# Patient Record
Sex: Female | Born: 1965 | Race: White | Hispanic: No | Marital: Married | Smoking: Former smoker
Health system: Southern US, Community
[De-identification: ages and names within clinical notes are randomized; demographics above are authoritative.]

## PROBLEM LIST (undated history)

## (undated) DIAGNOSIS — M1990 Unspecified osteoarthritis, unspecified site: Secondary | ICD-10-CM

## (undated) DIAGNOSIS — I10 Essential (primary) hypertension: Secondary | ICD-10-CM

## (undated) DIAGNOSIS — K219 Gastro-esophageal reflux disease without esophagitis: Secondary | ICD-10-CM

## (undated) DIAGNOSIS — G4733 Obstructive sleep apnea (adult) (pediatric): Secondary | ICD-10-CM

## (undated) DIAGNOSIS — F419 Anxiety disorder, unspecified: Secondary | ICD-10-CM

## (undated) DIAGNOSIS — E785 Hyperlipidemia, unspecified: Secondary | ICD-10-CM

## (undated) DIAGNOSIS — R011 Cardiac murmur, unspecified: Secondary | ICD-10-CM

## (undated) DIAGNOSIS — E119 Type 2 diabetes mellitus without complications: Secondary | ICD-10-CM

## (undated) DIAGNOSIS — G4730 Sleep apnea, unspecified: Secondary | ICD-10-CM

## (undated) HISTORY — PX: CHOLECYSTECTOMY: SHX55

## (undated) HISTORY — DX: Cardiac murmur, unspecified: R01.1

## (undated) HISTORY — DX: Type 2 diabetes mellitus without complications: E11.9

## (undated) HISTORY — DX: Gastro-esophageal reflux disease without esophagitis: K21.9

## (undated) HISTORY — DX: Hyperlipidemia, unspecified: E78.5

## (undated) HISTORY — DX: Anxiety disorder, unspecified: F41.9

## (undated) HISTORY — DX: Essential (primary) hypertension: I10

## (undated) HISTORY — DX: Sleep apnea, unspecified: G47.30

## (undated) HISTORY — DX: Unspecified osteoarthritis, unspecified site: M19.90

## (undated) HISTORY — DX: Obstructive sleep apnea (adult) (pediatric): G47.33

---

## 1998-02-11 ENCOUNTER — Other Ambulatory Visit: Admission: RE | Admit: 1998-02-11 | Discharge: 1998-02-11 | Payer: Self-pay | Admitting: Obstetrics and Gynecology

## 2000-01-27 ENCOUNTER — Encounter: Payer: Self-pay | Admitting: Family Medicine

## 2000-01-27 ENCOUNTER — Ambulatory Visit (HOSPITAL_COMMUNITY): Admission: RE | Admit: 2000-01-27 | Discharge: 2000-01-27 | Payer: Self-pay | Admitting: Family Medicine

## 2000-10-31 ENCOUNTER — Ambulatory Visit (HOSPITAL_COMMUNITY): Admission: RE | Admit: 2000-10-31 | Discharge: 2000-10-31 | Payer: Self-pay | Admitting: Family Medicine

## 2000-10-31 ENCOUNTER — Encounter: Payer: Self-pay | Admitting: Family Medicine

## 2000-11-20 ENCOUNTER — Other Ambulatory Visit: Admission: RE | Admit: 2000-11-20 | Discharge: 2000-11-20 | Payer: Self-pay | Admitting: Obstetrics and Gynecology

## 2001-06-06 ENCOUNTER — Ambulatory Visit (HOSPITAL_COMMUNITY): Admission: RE | Admit: 2001-06-06 | Discharge: 2001-06-06 | Payer: Self-pay | Admitting: Family Medicine

## 2001-06-06 ENCOUNTER — Encounter: Payer: Self-pay | Admitting: Family Medicine

## 2003-10-30 ENCOUNTER — Other Ambulatory Visit: Admission: RE | Admit: 2003-10-30 | Discharge: 2003-10-30 | Payer: Self-pay | Admitting: Obstetrics and Gynecology

## 2005-11-08 ENCOUNTER — Other Ambulatory Visit: Admission: RE | Admit: 2005-11-08 | Discharge: 2005-11-08 | Payer: Self-pay | Admitting: Obstetrics and Gynecology

## 2009-03-03 ENCOUNTER — Encounter: Admission: RE | Admit: 2009-03-03 | Discharge: 2009-03-03 | Payer: Self-pay | Admitting: Obstetrics and Gynecology

## 2009-05-29 ENCOUNTER — Encounter: Payer: Self-pay | Admitting: Family Medicine

## 2009-05-29 LAB — CONVERTED CEMR LAB: Pap Smear: NORMAL

## 2010-02-28 ENCOUNTER — Ambulatory Visit: Payer: Self-pay | Admitting: Family Medicine

## 2010-02-28 DIAGNOSIS — F411 Generalized anxiety disorder: Secondary | ICD-10-CM | POA: Insufficient documentation

## 2010-08-24 ENCOUNTER — Encounter
Admission: RE | Admit: 2010-08-24 | Discharge: 2010-08-24 | Payer: Self-pay | Source: Home / Self Care | Attending: Obstetrics and Gynecology | Admitting: Obstetrics and Gynecology

## 2010-08-29 LAB — HM MAMMOGRAPHY: HM Mammogram: NORMAL

## 2010-09-18 ENCOUNTER — Encounter: Payer: Self-pay | Admitting: Family Medicine

## 2010-09-30 NOTE — Assessment & Plan Note (Signed)
Summary: L arm bug bite x 1 dy rm 3   Vital Signs:  Patient Profile:   45 Years Old Female CC:      Bug bite - L arm x 1 dy Height:     60 inches Weight:      185 pounds O2 Sat:      100 % O2 treatment:    Room Air Temp:     99.2 degrees F oral Pulse rate:   86 / minute Pulse rhythm:   regular Resp:     16 per minute BP sitting:   132 / 88  (right arm) Cuff size:   regular  Vitals Entered By: Areta Haber CMA (February 28, 2010 4:36 PM)                  Current Allergies: No known allergies History of Present Illness Chief Complaint: Bug bite - L arm x 1 dy History of Present Illness:  Subjective:  Patient was outside 2 days ago and awoke yesterday with some type of insect bite on left forearm.  The area of redness has increased over the past 24 hours, and has been slightly warm.  She feels well.  No fevers, chills, and sweats   Current Problems: INSECT BITE, LOWER ARM (ICD-913.4) FAMILY HISTORY DIABETES 1ST DEGREE RELATIVE (ICD-V18.0) ANXIETY (ICD-300.00)   Current Meds ZOLOFT 50 MG TABS (SERTRALINE HCL) 1 tab by mouth once daily CEPHALEXIN 500 MG CAPS (CEPHALEXIN) One by mouth two times a day  REVIEW OF SYSTEMS Constitutional Symptoms      Denies fever, chills, night sweats, weight loss, weight gain, and fatigue.  Eyes       Denies change in vision, eye pain, eye discharge, glasses, contact lenses, and eye surgery. Ear/Nose/Throat/Mouth       Denies hearing loss/aids, change in hearing, ear pain, ear discharge, dizziness, frequent runny nose, frequent nose bleeds, sinus problems, sore throat, hoarseness, and tooth pain or bleeding.  Respiratory       Denies dry cough, productive cough, wheezing, shortness of breath, asthma, bronchitis, and emphysema/COPD.  Cardiovascular       Denies murmurs, chest pain, and tires easily with exhertion.    Gastrointestinal       Denies stomach pain, nausea/vomiting, diarrhea, constipation, blood in bowel movements, and  indigestion. Genitourniary       Denies painful urination, kidney stones, and loss of urinary control. Neurological       Denies paralysis, seizures, and fainting/blackouts. Musculoskeletal       Complains of redness and swelling.      Denies muscle pain, joint pain, joint stiffness, decreased range of motion, muscle weakness, and gout.  Skin       Denies bruising, unusual mles/lumps or sores, and hair/skin or nail changes.      Comments: L arm - bug bite x 1 dy Psych       Denies mood changes, temper/anger issues, anxiety/stress, speech problems, depression, and sleep problems.  Past History:  Past Medical History: Anxiety  Past Surgical History: Denies surgical history  Family History: Family History Diabetes 1st degree relative Family History High cholesterol Family History Hypertension Family History of Cardiovascular disorder  Social History: Married Never Smoked Alcohol use-no Drug use-yes Regular exercise-yes Smoking Status:  never Drug Use:  yes Does Patient Exercise:  yes   Objective:  No acute distress  Left forearm:  3cm dia erythematous macule, non tender. Assessment New Problems: INSECT BITE, LOWER ARM (ICD-913.4) FAMILY HISTORY DIABETES  1ST DEGREE RELATIVE (ICD-V18.0) ANXIETY (ICD-300.00)  INSECT BITE; ? EARLY CELLULITIS  Plan New Medications/Changes: CEPHALEXIN 500 MG CAPS (CEPHALEXIN) One by mouth two times a day  #14 x 0, 02/28/2010, Donna Christen MD  New Orders: New Patient Level III [id number] Planning Comments:   Begin Keflex.  Recommend Benadryl 25mg , 2 caps every 6 to 8 hours. Follow-up with PCP if not improving.   The patient and/or caregiver has been counseled thoroughly with regard to medications prescribed including dosage, schedule, interactions, rationale for use, and possible side effects and they verbalize understanding.  Diagnoses and expected course of recovery discussed and will return if not improved as expected or if the  condition worsens. Patient and/or caregiver verbalized understanding.  Prescriptions: CEPHALEXIN 500 MG CAPS (CEPHALEXIN) One by mouth two times a day  #14 x 0   Entered and Authorized by:   Donna Christen MD   Signed by:   Donna Christen MD on 02/28/2010   Method used:   Print then Give to Patient   RxID:   [id number]   Patient Instructions: 1)  Take Benadryl 25mg , 2 caps every 6 to 8 hours until skin reaction improved.  Orders Added: 1)  New Patient Level III [95621]

## 2010-10-19 ENCOUNTER — Ambulatory Visit (INDEPENDENT_AMBULATORY_CARE_PROVIDER_SITE_OTHER): Payer: Commercial Managed Care - PPO | Admitting: Emergency Medicine

## 2010-10-19 ENCOUNTER — Encounter: Payer: Self-pay | Admitting: Emergency Medicine

## 2010-10-19 DIAGNOSIS — J020 Streptococcal pharyngitis: Secondary | ICD-10-CM

## 2010-10-19 LAB — CONVERTED CEMR LAB: Rapid Strep: NEGATIVE

## 2010-10-20 ENCOUNTER — Encounter: Payer: Self-pay | Admitting: Emergency Medicine

## 2010-10-22 ENCOUNTER — Telehealth (INDEPENDENT_AMBULATORY_CARE_PROVIDER_SITE_OTHER): Payer: Self-pay | Admitting: *Deleted

## 2010-10-26 NOTE — Progress Notes (Signed)
  Phone Note Outgoing Call Call back at Home Phone [phone] P PH     Call placed by: Lajean Saver RN,  October 22, 2010 2:57 PM Call placed to: Patient Action Taken: Phone Call Completed Summary of Call: Callback: Patient given negative throat culture result.

## 2010-10-26 NOTE — Assessment & Plan Note (Signed)
Summary: SORE THROAT/TJ rm 3   Vital Signs:  Patient Profile:   45 Years Old Female CC:      sore throat Height:     60 inches O2 Sat:      97 % O2 treatment:    Room Air Temp:     98.5 degrees F oral Pulse rate:   81 / minute Resp:     16 per minute BP sitting:   156 / 100  (left arm) Cuff size:   regular  Vitals Entered By: Clemens Catholic LPN (October 19, 2010 7:24 PM)                  Updated Prior Medication List: No Medications Current Allergies (reviewed today): No known allergies History of Present Illness History from: patient Chief Complaint: sore throat History of Present Illness: 45 Years Old Female complains of onset of cold symptoms for 1 day.  Charna has been using no OTC meds.  He daughter was just dx with strep via culture yesterday at our clinic. + sore raw throat No cough No pleuritic pain No wheezing No nasal congestion No post-nasal drainage No sinus pain/pressure No chest congestion No itchy/red eyes No earache No hemoptysis No SOB No chills/sweats No fever No nausea No vomiting No abdominal pain No diarrhea No skin rashes No fatigue No myalgias No headache   REVIEW OF SYSTEMS Constitutional Symptoms      Denies fever, chills, night sweats, weight loss, weight gain, and fatigue.  Eyes       Denies change in vision, eye pain, eye discharge, glasses, contact lenses, and eye surgery. Ear/Nose/Throat/Mouth       Complains of sore throat.      Denies hearing loss/aids, change in hearing, ear pain, ear discharge, dizziness, frequent runny nose, frequent nose bleeds, sinus problems, hoarseness, and tooth pain or bleeding.  Respiratory       Denies dry cough, productive cough, wheezing, shortness of breath, asthma, bronchitis, and emphysema/COPD.  Cardiovascular       Denies murmurs, chest pain, and tires easily with exhertion.    Gastrointestinal       Denies stomach pain, nausea/vomiting, diarrhea, constipation, blood in bowel  movements, and indigestion. Genitourniary       Denies painful urination, kidney stones, and loss of urinary control. Neurological       Denies paralysis, seizures, and fainting/blackouts. Musculoskeletal       Denies muscle pain, joint pain, joint stiffness, decreased range of motion, redness, swelling, muscle weakness, and gout.  Skin       Denies bruising, unusual mles/lumps or sores, and hair/skin or nail changes.  Psych       Denies mood changes, temper/anger issues, anxiety/stress, speech problems, depression, and sleep problems. Other Comments: pt c/o sore throat.   Past History:  Past Medical History: Reviewed history from 02/28/2010 and no changes required. Anxiety  Past Surgical History:  Caesarean section Cholecystectomy  Family History: Reviewed history from 02/28/2010 and no changes required. Family History Diabetes 1st degree relative Family History High cholesterol Family History Hypertension Family History of Cardiovascular disorder  Social History: Reviewed history from 02/28/2010 and no changes required. Married Never Smoked Alcohol use-no Drug use-yes Regular exercise-yes Physical Exam General appearance: well developed, well nourished, no acute distress Ears: clear fluid and pressure bilateral TMs Nasal: mucosa pink, nonedematous, no septal deviation, turbinates normal Oral/Pharynx: tongue normal, posterior pharynx without erythema or exudate Chest/Lungs: no rales, wheezes, or rhonchi bilateral, breath sounds equal without  effort Heart: regular rate and  rhythm, no murmur MSE: oriented to time, place, and person Assessment New Problems: STREP THROAT (ICD-034.0)   Plan New Medications/Changes: AMOXICILLIN 875 MG TABS (AMOXICILLIN) 1 by mouth two times a day for 10 days  #20 x 0, 10/19/2010, Hoyt Koch MD  New Orders: Est. Patient Level III [54098] Rapid Strep [11914] T-Culture, Throat [[id number]] Planning Comments:   1)  Take  the prescribed antibiotic as instructed.  Culture pending. 2)  Use nasal saline solution (over the counter) at least 3 times a day.  Change toothbrush tomorrow.  Hydration.  Sore throat sprays.  3)  Use over the counter decongestants like Zyrtec-D every 12 hours as needed to help with congestion. 4)  Can take tylenol every 6 hours or motrin every 8 hours for pain or fever. 5)  Follow up with your primary doctor  if no improvement in 5-7 days, sooner if increasing pain, fever, or new symptoms.    The patient and/or caregiver has been counseled thoroughly with regard to medications prescribed including dosage, schedule, interactions, rationale for use, and possible side effects and they verbalize understanding.  Diagnoses and expected course of recovery discussed and will return if not improved as expected or if the condition worsens. Patient and/or caregiver verbalized understanding.  Prescriptions: AMOXICILLIN 875 MG TABS (AMOXICILLIN) 1 by mouth two times a day for 10 days  #20 x 0   Entered and Authorized by:   Hoyt Koch MD   Signed by:   Hoyt Koch MD on 10/19/2010   Method used:   Print then Give to Patient   RxID:   [id number]   Orders Added: 1)  Est. Patient Level III [32440] 2)  Rapid Strep [10272] 3)  T-Culture, Throat [[id number]]    Laboratory Results  Date/Time Received: October 19, 2010 7:26 PM  Date/Time Reported: October 19, 2010 7:26 PM   Other Tests  Rapid Strep: negative  Kit Test Internal QC: Negative   (Normal Range: Negative)

## 2010-11-09 ENCOUNTER — Encounter: Payer: Self-pay | Admitting: Family Medicine

## 2010-11-09 ENCOUNTER — Ambulatory Visit (INDEPENDENT_AMBULATORY_CARE_PROVIDER_SITE_OTHER): Payer: Commercial Managed Care - PPO | Admitting: Family Medicine

## 2010-11-09 DIAGNOSIS — R030 Elevated blood-pressure reading, without diagnosis of hypertension: Secondary | ICD-10-CM

## 2010-11-09 DIAGNOSIS — F39 Unspecified mood [affective] disorder: Secondary | ICD-10-CM | POA: Insufficient documentation

## 2010-11-16 NOTE — Assessment & Plan Note (Signed)
Summary: NOV: BP, mood   Vital Signs:  Patient profile:   45 year old female Height:      60 inches Weight:      187 pounds Pulse rate:   90 / minute BP sitting:   137 / 89  (right arm) Cuff size:   regular  Vitals Entered By: Avon Gully CMA, Duncan Dull) (November 09, 2010 3:09 PM) CC: NP-est care bP concerns   CC:  NP-est care bP concerns.  History of Present Illness: BP goes u when she feels very swollen. BP seems [place] up and down.  Says she needs wo exercise and lose weight. REaly doesn't want to take BP meds if can help it though would like something for her swelling and to use it intermittantly. NO HA or CP or SOB.   has been weapy. Still gets regular periods. No pattern to it. Doesn't feel depressed but says little things cause her to feel tearful.  WAs on zoloft a year ago adn that did work . Also felt the zoloft suppressed her appetite.  No irritabiility.  She feels like it is embarrassing.  Does feel al ittle anxious at time.  She has also been thinking about an IUD as well. Has discussed this with her GYN.   Habits & Providers  Alcohol-Tobacco-Diet     Alcohol drinks/day: 0     Tobacco Status: quit     Year Quit: 2008  Exercise-Depression-Behavior     Does Patient Exercise: yes     Type of exercise: walking  Current Medications (verified): 1)  Multivitamins  Caps (Multiple Vitamin)  Allergies (verified): No Known Drug Allergies  Comments:  Nurse/Medical Assistant: The patient's medications and allergies were reviewed with the patient and were updated in the Medication and Allergy Lists. 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  HS grad.  Married to [employer].  2 kids.   Never Smoked Alcohol use-no Drug use-yes Regular exercise-yes Former Smoker Smoking Status:  quit  Review of Systems       No fever/sweats/weakness, unexplained weight loss/gain.  No vison changes.  No difficulty hearing/ringing in ears, hay fever/allergies.  No chest pain/discomfort, palpitations.  No Br lump/nipple discharge.  No cough/wheeze.  No blood in BM, nausea/vomiting/diarrhea.  No nighttime urination, leaking urine, unusual vaginal bleeding, discharge (penis or vagina).  No muscle/joint pain. No rash, change in mole.  No HA, memory loss.  + anxiety, no sleep d/o, depression.  No easy bruising/bleeding, unexplained lump   Physical Exam  General:  Well-developed,well-nourished,in no acute distress; alert,appropriate and cooperative throughout examination Neck:  No deformities, masses, or tenderness noted. NO TM.  Lungs:  Normal respiratory effort, chest expands symmetrically. Lungs are clear to auscultation, no crackles or wheezes. Heart:  Normal rate and regular rhythm. S1 and S2 normal without gallop, murmur, click, rub or other extra sounds. NO carotid bruits.  Skin:  no rashes.   Cervical Nodes:  No lymphadenopathy noted Psych:  Cognition and judgment appear intact. Alert and cooperative with normal attention span and concentration. No apparent delusions, illusions, hallucinations   Impression & Recommendations:  Problem # 1:  ELEVATED BLOOD PRESSURE WITHOUT DIAGNOSIS OF HYPERTENSION (ICD-796.2) Can start with HCT as needed for swelling.  Also discussed importance of healthy low sat diet (rec DASH diet), and regular exercise and weight loss.  F/U in 4-6 weeks. Track BPs.  Also she had labs done throught work so she will bring a copy of these by the office.  Her updated medication list for this problem includes:    Hydrochlorothiazide 25 Mg Tabs (Hydrochlorothiazide) .Marland Kitchen... 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 She does feel it is hromonal. Discussed IUD vs OCPs vs SSRI. WE She has been on zoloft in the past and did well onthis.  She will think about these options adn let me know.    Complete Medication List: 1)  Multivitamins Caps (Multiple vitamin) 2)  Hydrochlorothiazide 25 Mg Tabs (Hydrochlorothiazide) .... Take 1 tablet by mouth once a day  Patient Instructions: 1)  DASH diet.   (google it. http://www.myers.net/ website) 2)  Please schedule a follow-up appointment in 4-6 weeks for blood pressure. 3)  Regular exercise.   Prescriptions: HYDROCHLOROTHIAZIDE 25 MG TABS (HYDROCHLOROTHIAZIDE) Take 1 tablet by mouth once a day  #30 x 1   Entered and Authorized by:   Nani Gasser MD   Signed by:   Nani Gasser MD on 11/09/2010   Method used:   Electronically to        [facility]* (retail)       [address] [place], [place]  [address]       Ph: [phone]       Fax: [phone]   RxID:   [id number]    Orders Added: 1)  New Patient Level III [84132]   Immunization History:  Tetanus/Td Immunization History:    Tetanus/Td:  historical (08/29/2009)  Influenza Immunization History:    Influenza:  historical (05/29/2010)   Immunization History:  Tetanus/Td Immunization History:    Tetanus/Td:  Historical (08/29/2009)  Influenza Immunization History:    Influenza:  Historical (05/29/2010)   Immunization History:  Tetanus/Td Immunization History:    Tetanus/Td:  historical (08/29/2009)  Influenza Immunization History:    Influenza:  historical (05/29/2010)    Preventive Care Screening  Mammogram:    Date:  08/29/2010    Next Due:  08/2011    Results:  normal   Pap Smear:    Date:  05/29/2009    Next Due:  05/2011    Results:  normal   Last Tetanus Booster:    Date:  08/29/2009    Results:  Historical    Appended Document: NOV: BP, mood   Lipid  Panel Test Date: 06/17/2010                        Value        Units        H/L   Reference  Cholesterol:          197          mg/dL              (440-102) LDL Cholesterol:      130          mg/dL              (72-536) HDL Cholesterol:      44           mg/dL              (  30-80) Triglyceride:         113          mg/dL              (16-109)

## 2011-02-07 ENCOUNTER — Telehealth: Payer: Self-pay | Admitting: Family Medicine

## 2011-02-07 MED ORDER — SERTRALINE HCL 50 MG PO TABS: 50.0000 mg | ORAL_TABLET | Freq: Every day | ORAL | Status: DC

## 2011-02-07 NOTE — Telephone Encounter (Signed)
Advised pt to sched F/U in 3wk. Pt agreed

## 2011-02-07 NOTE — Telephone Encounter (Signed)
Pt calls and states you mentioned if she wanted a RX for zoloft to be called in, you would. She does, to [facility] [phone]. Also, FYI, she will sched a BP F/U today.

## 2011-02-07 NOTE — Telephone Encounter (Signed)
OK will send med.  Can you enter pharm and send. Also will need f/u in 3-4 weeks before due for refill to see how doing on th emed.

## 2011-02-08 ENCOUNTER — Ambulatory Visit: Payer: Commercial Managed Care - PPO | Admitting: Family Medicine

## 2011-04-08 ENCOUNTER — Encounter: Payer: Self-pay | Admitting: Family Medicine

## 2011-04-12 ENCOUNTER — Ambulatory Visit
Admission: RE | Admit: 2011-04-12 | Discharge: 2011-04-12 | Disposition: A | Discharge status: Home / Self Care | Payer: Commercial Managed Care - PPO | Source: Ambulatory Visit | Attending: Family Medicine | Admitting: Family Medicine

## 2011-04-12 ENCOUNTER — Encounter: Payer: Self-pay | Admitting: Family Medicine

## 2011-04-12 ENCOUNTER — Other Ambulatory Visit: Payer: Self-pay | Admitting: Family Medicine

## 2011-04-12 ENCOUNTER — Ambulatory Visit (INDEPENDENT_AMBULATORY_CARE_PROVIDER_SITE_OTHER): Payer: Commercial Managed Care - PPO | Admitting: Family Medicine

## 2011-04-12 ENCOUNTER — Telehealth: Payer: Self-pay | Admitting: Family Medicine

## 2011-04-12 VITALS — BP 140/89 | HR 81 | Ht 60.0 in | Wt 179.0 lb

## 2011-04-12 DIAGNOSIS — M546 Pain in thoracic spine: Secondary | ICD-10-CM

## 2011-04-12 DIAGNOSIS — Z801 Family history of malignant neoplasm of trachea, bronchus and lung: Secondary | ICD-10-CM

## 2011-04-12 DIAGNOSIS — R9389 Abnormal findings on diagnostic imaging of other specified body structures: Secondary | ICD-10-CM

## 2011-04-12 DIAGNOSIS — Z87891 Personal history of nicotine dependence: Secondary | ICD-10-CM

## 2011-04-12 DIAGNOSIS — R030 Elevated blood-pressure reading, without diagnosis of hypertension: Secondary | ICD-10-CM

## 2011-04-12 DIAGNOSIS — I10 Essential (primary) hypertension: Secondary | ICD-10-CM

## 2011-04-12 MED ORDER — SERTRALINE HCL 50 MG PO TABS: 50.0000 mg | ORAL_TABLET | Freq: Every day | ORAL | Status: DC

## 2011-04-12 MED ORDER — HYDROCHLOROTHIAZIDE 25 MG PO TABS: 25.0000 mg | ORAL_TABLET | Freq: Every day | ORAL | Status: DC

## 2011-04-12 NOTE — Telephone Encounter (Signed)
Call pt: No infection on CXR though they did see a spot over the left 2nd rib area. Has she had an old CXR that we could get to compare? If not then we can repeat the CXR in 3 months to see if stable. Thoracic spine film is normal.  Since has already done some PT and chiropractic care then consider MRI to further eval the thoracic spine.

## 2011-04-12 NOTE — Assessment & Plan Note (Signed)
Well controlle t work and home. Will keep an eye on it.  Recheck in 6 mo.

## 2011-04-12 NOTE — Progress Notes (Signed)
  Subjective:    Patient ID: Kristen Bright, female    DOB: Jul 27, 1966, 45 y.o.   MRN: [id number]  HPI  Having thoracic back pain. Started about 3 mo ago after a URI where she had a cough for about a month. No sig SOB, maybe some with activity but she thinks it may be deconditioning.  Also has notices some increased phlegm production in the morning. Pain seems to be worse when turns over a night and then will get a radiating sensation around to her sternum.  She is a side sleeper.  She says will bother her during the day too when takes  Areally deep breath. It feels better when she doesn't wear a bra.  Worse when she leans forward.  No pain with turning.  Has tried traction, IBU, and seeing a chiropracter.   Her to f/u elevated BP - She uses her HCTZ prn for swelling. BP slighlty elevated today but home BPS and BPs checked at work have been normal.    Review of Systems     Objective:   Physical Exam  Constitutional: She is oriented to person, place, and time. She appears well-developed and well-nourished.  Cardiovascular: Normal rate, regular rhythm and normal heart sounds.   Pulmonary/Chest: Effort normal and breath sounds normal.  Musculoskeletal: She exhibits no edema.       Neck and shoulders with NROM. Tender over the thoracic spine just above her bra line.  Normal side bending and rotation right and left.    Neurological: She is alert and oriented to person, place, and time.  Skin: Skin is warm and dry.  Psychiatric: She has a normal mood and affect.          Assessment & Plan:  Thoracic back pain - Consider herniated disc since gets radiation to the front of her chest.  Can use IBU if needed. Will start with xray to see if fracture or sign of herniated disc. Will also get CXR since has noticed some inc phlegm production and questionable SOB.

## 2011-04-12 NOTE — Telephone Encounter (Signed)
Lurena Joiner with [facility] called and an order was sent electronically for 2 view T -Spine and they need to change the order to read as:  T spine with swimmers. Plan:  Verbal order auth and given per Dr. Linford Arnold okay. Jarvis Newcomer, LPN Domingo Dimes

## 2011-04-13 NOTE — Telephone Encounter (Signed)
Called pt and notified of results. She is ok wirth the MRI. Pt concerned about this spot- says her grandmother has lung cancer and wanted to know your thoughts on this and what she can do to have it fully checked out

## 2011-04-13 NOTE — Telephone Encounter (Signed)
Pt notified and states she is a former smoker with parents who smoked in the house the whole time she was growing up. Would like to get a chest CT done.

## 2011-04-13 NOTE — Telephone Encounter (Signed)
Chest CT ordered

## 2011-04-13 NOTE — Telephone Encounter (Addendum)
This and we can do is try to get old chest x-rays she knows where she might have 1 such as an urgent care etc.  Fortunately she is overall low risk as she's not a smoker. The radiologist felt that this was most likely just an incidental finding but they did recommend trying to follow it out just to be on teh safe side. At this point I don't think we need to get a chest CT. We can repeat a chest x-ray in 2 months instead of 3 and she would like. There has been a little bit of time in between the x-rays so that we can actually detect a change in size, if there is.

## 2011-04-14 ENCOUNTER — Telehealth: Payer: Self-pay | Admitting: Family Medicine

## 2011-04-14 ENCOUNTER — Ambulatory Visit
Admission: RE | Admit: 2011-04-14 | Discharge: 2011-04-14 | Disposition: A | Discharge status: Home / Self Care | Payer: Commercial Managed Care - PPO | Source: Ambulatory Visit | Attending: Family Medicine | Admitting: Family Medicine

## 2011-04-14 DIAGNOSIS — Z801 Family history of malignant neoplasm of trachea, bronchus and lung: Secondary | ICD-10-CM

## 2011-04-14 DIAGNOSIS — Z87891 Personal history of nicotine dependence: Secondary | ICD-10-CM

## 2011-04-14 DIAGNOSIS — R9389 Abnormal findings on diagnostic imaging of other specified body structures: Secondary | ICD-10-CM

## 2011-04-14 NOTE — Telephone Encounter (Signed)
Pt notified of results. Pt wanted to know if a CT of her back would show the same as an MRI- out of pocket cost for the MRI is gonna be between 400.00- 500.00 dollars and the CT was 200.00. If not she will do the MRI but she just wanted to ask first

## 2011-04-14 NOTE — Telephone Encounter (Signed)
Her chest CT was completely normal. They feel that what they saw on the x-ray might just been artifact.

## 2011-04-15 NOTE — Telephone Encounter (Signed)
The CT will show her vertebra which all look normal but doesn't do as good a job of looking at the discs inbetween. I say lets have her see ortho which sounds like is a lot cheaper than an MRI.  Let me know if OK with the referrral.

## 2011-04-15 NOTE — Telephone Encounter (Signed)
Pt notified and states she would like to try and really get on her exercises at home to see if this helps first before doing anything and will let us know if needs [place].

## 2011-05-06 ENCOUNTER — Telehealth: Payer: Self-pay | Admitting: *Deleted

## 2011-05-06 MED ORDER — CIPROFLOXACIN HCL 0.3 % OP SOLN: 1.0000 [drp] | OPHTHALMIC | Status: AC

## 2011-05-06 NOTE — Telephone Encounter (Signed)
Pt calls and states she has pink eye- white, green drainage from eye, some itching, tender to touch and request some eye drops be sent to [facility] in [place].

## 2011-05-06 NOTE — Telephone Encounter (Signed)
Pt also denies vision changes. Rx sent. If not better f/u on Monday.

## 2011-07-15 ENCOUNTER — Encounter: Payer: Self-pay | Admitting: Emergency Medicine

## 2011-07-15 ENCOUNTER — Emergency Department
Admission: EM | Admit: 2011-07-15 | Discharge: 2011-07-15 | Disposition: A | Discharge status: Home / Self Care | Payer: Commercial Managed Care - PPO | Source: Home / Self Care | Attending: Family Medicine | Admitting: Family Medicine

## 2011-07-15 DIAGNOSIS — R300 Dysuria: Secondary | ICD-10-CM

## 2011-07-15 DIAGNOSIS — N3000 Acute cystitis without hematuria: Secondary | ICD-10-CM

## 2011-07-15 LAB — POCT URINALYSIS DIPSTICK
Bilirubin, UA: NEGATIVE
Blood, UA: NEGATIVE
Glucose, UA: NEGATIVE
Ketones, UA: NEGATIVE
Nitrite, UA: NEGATIVE
pH, UA: 6 (ref 5–8)

## 2011-07-15 MED ORDER — SULFAMETHOXAZOLE-TRIMETHOPRIM 800-160 MG PO TABS: 1.0000 | ORAL_TABLET | Freq: Two times a day (BID) | ORAL | Status: AC

## 2011-07-15 NOTE — ED Notes (Signed)
Dysuria x 3 days 

## 2011-07-18 NOTE — ED Provider Notes (Signed)
History     CSN: [id number] Arrival date & time: 07/15/2011  3:40 PM   First MD Initiated Contact with Patient 07/15/11 1634      Chief Complaint  Patient presents with  . Dysuria     HPI Comments: Patient complains of 3 day history of UTI symptoms.  Patient is a 45 y.o. female presenting with dysuria. The history is provided by the patient.  Dysuria  This is a new problem. The current episode started more than 2 days ago. The problem occurs every urination. The problem has been gradually worsening. The quality of the pain is described as burning. The pain is mild. There has been no fever. Associated symptoms include frequency, hesitancy and urgency. Pertinent negatives include no chills, no sweats, no nausea, no vomiting, no discharge, no hematuria and no flank pain. She has tried nothing for the symptoms.    Past Medical History  Diagnosis Date  . Anxiety     Past Surgical History  Procedure Date  . Cesarean section   . Cholecystectomy     Family History  Problem Relation Age of Onset  . Diabetes      fam hx  . Hyperlipidemia      fam hx  . Hypertension      fam hx  . Heart disease      fam hx  . Heart attack Father   . Hyperlipidemia Father   . Hypertension Father     History  Substance Use Topics  . Smoking status: Former Games developer  . Smokeless tobacco: Not on file  . Alcohol Use: No    OB History    Grav Para Term Preterm Abortions TAB SAB Ect Mult Living                  Review of Systems  Constitutional: Negative.  Negative for chills.  HENT: Negative.   Eyes: Negative.   Respiratory: Negative.   Cardiovascular: Negative.   Gastrointestinal: Negative.  Negative for nausea and vomiting.  Genitourinary: Positive for dysuria, hesitancy, urgency and frequency. Negative for hematuria and flank pain.  Musculoskeletal: Negative for back pain.  Skin: Negative.   Neurological: Negative.     Allergies  Review of patient's allergies indicates no  known allergies.  Home Medications   Current Outpatient Rx  Name Route Sig Dispense Refill  . HYDROCHLOROTHIAZIDE 25 MG PO TABS Oral Take 1 tablet (25 mg total) by mouth daily. 90 tablet 1  . MULTIVITAMINS PO CAPS Oral Take 1 capsule by mouth daily.      . SERTRALINE HCL 50 MG PO TABS Oral Take 1 tablet (50 mg total) by mouth daily. 90 tablet 1  . SULFAMETHOXAZOLE-TRIMETHOPRIM 800-160 MG PO TABS Oral Take 1 tablet by mouth 2 (two) times daily. 10 tablet 0    BP 129/86  Pulse 86  Temp(Src) 98 F (36.7 C) (Oral)  Resp 16  Ht 5\' 3"  (1.6 m)  Wt 177 lb (80.287 kg)  BMI 31.35 kg/m2  SpO2 98%  LMP 06/24/2011  Physical Exam  Nursing note and vitals reviewed. Constitutional: She appears well-developed and well-nourished. No distress.  HENT:  Head: Normocephalic.  Mouth/Throat: Oropharynx is clear and moist.  Eyes: Pupils are equal, round, and reactive to light.  Neck: Neck supple.  Cardiovascular: Normal rate and normal heart sounds.   Pulmonary/Chest: Breath sounds normal.  Abdominal: She exhibits no distension.       Tenderness over bladder.  No CVA or flank tenderness  ED Course  Procedures:  None   Labs Reviewed  POCT URINALYSIS DIPSTICK:  Small leuks  URINE CULTURE Pending      1. Dysuria   2. Acute cystitis       MDM  Urine culture pending Increase fluid intake. Begin Septra DS. Followup with PCP if not improving.        Donna Christen, MD 07/18/11 [phone]

## 2011-09-08 ENCOUNTER — Ambulatory Visit (INDEPENDENT_AMBULATORY_CARE_PROVIDER_SITE_OTHER): Payer: Commercial Managed Care - PPO | Admitting: Family Medicine

## 2011-09-08 VITALS — BP 127/87 | HR 101

## 2011-09-08 DIAGNOSIS — J029 Acute pharyngitis, unspecified: Secondary | ICD-10-CM

## 2011-09-08 NOTE — Progress Notes (Signed)
  Subjective:    Patient ID: Kristen Bright, female    DOB: 11-10-1965, 46 y.o.   MRN: [id number] Here for strep test. First day back to work after having the flu. Wanted to be tested as concerned of secondary infection. HPI    Review of Systems     Objective:   Physical Exam        Assessment & Plan:  Pharyngitis - Strep is negative. Gave reassurace. Call if not better in one week. Symptomatic care.

## 2012-01-03 ENCOUNTER — Encounter: Payer: Self-pay | Admitting: Physical Medicine & Rehabilitation

## 2012-03-13 ENCOUNTER — Encounter: Payer: Self-pay | Admitting: Family Medicine

## 2012-03-13 ENCOUNTER — Other Ambulatory Visit: Payer: Self-pay | Admitting: Family Medicine

## 2012-03-13 ENCOUNTER — Ambulatory Visit (INDEPENDENT_AMBULATORY_CARE_PROVIDER_SITE_OTHER): Payer: Commercial Managed Care - PPO | Admitting: Family Medicine

## 2012-03-13 VITALS — BP 135/85 | HR 83 | Ht 63.0 in | Wt 180.0 lb

## 2012-03-13 DIAGNOSIS — R1013 Epigastric pain: Secondary | ICD-10-CM

## 2012-03-13 DIAGNOSIS — R6889 Other general symptoms and signs: Secondary | ICD-10-CM

## 2012-03-13 DIAGNOSIS — I10 Essential (primary) hypertension: Secondary | ICD-10-CM

## 2012-03-13 MED ORDER — ESOMEPRAZOLE MAGNESIUM 40 MG PO CPDR: 40.0000 mg | DELAYED_RELEASE_CAPSULE | Freq: Every day | ORAL | Status: DC

## 2012-03-13 NOTE — Patient Instructions (Signed)
We will call you with your lab results. If you don't here from us in about a week then please give us a call at [phone].  

## 2012-03-13 NOTE — Progress Notes (Signed)
  Subjective:    Patient ID: Kristen Bright, female    DOB: 12/29/1965, 46 y.o.   MRN: [id number]  HPI Had stomach crampis in the epigastrum 5 days ago. It woke her up at night  Says felt like a hard spasm and in between it woule feel tender.  Hx of GB removal. Had a few soft stools but no actual diarrhea.  Says it seem to go away. No blood in the stomach.  No nausea or vomiting.   No fever.  She ate normally. No jaundice. 2 nexium twice that day and did get relief for about 1 hour each time.  But otherwise hasn't been taking it regularly.  Says has a fullnes sensation in her neck at all.   Hypertension-she's also here to followup for high blood pressure. She says she takes her HCTZ as needed. She checks her blood pressure home and has been very well controlled. No chest pain or shortness of breath no lightheadedness. She is due for CMP.  Review of Systems     Objective:   Physical Exam  Constitutional: She is oriented to person, place, and time. She appears well-developed and well-nourished.  HENT:  Head: Normocephalic and atraumatic.  Right Ear: External ear normal.  Left Ear: External ear normal.  Nose: Nose normal.  Mouth/Throat: Oropharynx is clear and moist.       TMs and canals are clear.   Eyes: Conjunctivae and EOM are normal. Pupils are equal, round, and reactive to light.  Neck: Neck supple. No thyromegaly present.  Cardiovascular: Normal rate, regular rhythm and normal heart sounds.   Pulmonary/Chest: Effort normal and breath sounds normal. She has no wheezes.  Abdominal: Soft. Bowel sounds are normal. She exhibits no distension and no mass. There is tenderness. There is no rebound and no guarding.       Tender near the umbilicus   Lymphadenopathy:    She has no cervical adenopathy.  Neurological: She is alert and oriented to person, place, and time.  Skin: Skin is warm and dry.  Psychiatric: She has a normal mood and affect. Her behavior is normal.            Assessment & Plan:  Stomach pain/cramps- unclear etiology at this point. She really doesn't think it was food poisoning as she's had that before. Consider could be her reflux would like her to restart her Nexium at least daily for the next couple of weeks. We will check her amylase and lipase to check out her pancreas. She is Re: had her gallbladder removed but it is less likely that she still could have a stone in one of the bile the skin the liver. I will also check her liver enzymes. Check CBC to rule out any type of acute infection. She is already feeling significantly better. Please call if this happens again we'll repeat the blood work acutely.  HTN - Check CMP. Doing well. Continue HCTZ. F/U in 6 months.

## 2012-03-14 LAB — COMPLETE METABOLIC PANEL WITH GFR
ALT: 22 U/L (ref 0–35)
AST: 20 U/L (ref 0–37)
Albumin: 4.3 g/dL (ref 3.5–5.2)
Alkaline Phosphatase: 57 U/L (ref 39–117)
GFR, Est Non African American: >89 mL/min
Potassium: 3.7 mEq/L (ref 3.5–5.3)
Sodium: 138 mEq/L (ref 135–145)
Total Bilirubin: 0.4 mg/dL (ref 0.3–1.2)
Total Protein: 7 g/dL (ref 6.0–8.3)

## 2012-03-14 LAB — CBC WITH DIFFERENTIAL/PLATELET
Basophils Absolute: 0 10*3/uL (ref 0.0–0.1)
Basophils Relative: 0 % (ref 0–1)
Eosinophils Absolute: 0.1 10*3/uL (ref 0.0–0.7)
Hemoglobin: 11.8 g/dL — ABNORMAL LOW (ref 12.0–15.0)
MCH: 26.2 pg (ref 26.0–34.0)
MCHC: 32.9 g/dL (ref 30.0–36.0)
Monocytes Relative: 8 % (ref 3–12)
Neutro Abs: 3.7 10*3/uL (ref 1.7–7.7)
Neutrophils Relative %: 55 % (ref 43–77)
Platelets: 305 10*3/uL (ref 150–400)
RBC: 4.5 MIL/uL (ref 3.87–5.11)

## 2012-03-14 LAB — VITAMIN B12: Vitamin B-12: 366 pg/mL (ref 211–911)

## 2012-03-14 LAB — LIPASE: Lipase: 49 U/L (ref 0–75)

## 2012-03-14 LAB — TSH: TSH: 1.761 u[IU]/mL (ref 0.350–4.500)

## 2012-03-14 LAB — FERRITIN: Ferritin: 11 ng/mL (ref 10–291)

## 2012-03-14 LAB — AMYLASE: Amylase: 44 U/L (ref 0–105)

## 2012-03-15 LAB — VITAMIN D 25 HYDROXY (VIT D DEFICIENCY, FRACTURES): Vit D, 25-Hydroxy: 41 ng/mL (ref 30–89)

## 2012-03-20 ENCOUNTER — Ambulatory Visit: Payer: Commercial Managed Care - PPO | Admitting: Family Medicine

## 2012-08-09 ENCOUNTER — Other Ambulatory Visit: Payer: Self-pay | Admitting: Obstetrics and Gynecology

## 2012-08-09 DIAGNOSIS — Z1231 Encounter for screening mammogram for malignant neoplasm of breast: Secondary | ICD-10-CM

## 2012-08-14 ENCOUNTER — Telehealth: Payer: Self-pay | Admitting: *Deleted

## 2012-08-14 ENCOUNTER — Ambulatory Visit (INDEPENDENT_AMBULATORY_CARE_PROVIDER_SITE_OTHER): Payer: Commercial Managed Care - PPO

## 2012-08-14 DIAGNOSIS — Z1231 Encounter for screening mammogram for malignant neoplasm of breast: Secondary | ICD-10-CM

## 2012-08-14 DIAGNOSIS — Z0000 Encounter for general adult medical examination without abnormal findings: Secondary | ICD-10-CM

## 2012-08-14 NOTE — Telephone Encounter (Signed)
Patient called and requesting labs before her appointment  What does she need?

## 2012-08-14 NOTE — Telephone Encounter (Signed)
CMP, lipid, TSH. Dx code v70.0.

## 2012-08-27 LAB — LIPID PANEL
LDL Cholesterol: 115 mg/dL — ABNORMAL HIGH (ref 0–99)
Triglycerides: 199 mg/dL — ABNORMAL HIGH (ref <150)
VLDL: 40 mg/dL (ref 0–40)

## 2012-08-27 LAB — COMPLETE METABOLIC PANEL WITH GFR
ALT: 24 U/L (ref 0–35)
AST: 18 U/L (ref 0–37)
CO2: 24 mEq/L (ref 19–32)
Calcium: 8.8 mg/dL (ref 8.4–10.5)
Chloride: 106 mEq/L (ref 96–112)
Creat: 0.58 mg/dL (ref 0.50–1.10)
GFR, Est African American: >89 mL/min
Potassium: 4 mEq/L (ref 3.5–5.3)
Sodium: 138 mEq/L (ref 135–145)
Total Protein: 6.5 g/dL (ref 6.0–8.3)

## 2012-08-27 LAB — TSH: TSH: 3.467 u[IU]/mL (ref 0.350–4.500)

## 2012-08-28 ENCOUNTER — Encounter: Payer: Self-pay | Admitting: Family Medicine

## 2012-08-28 ENCOUNTER — Ambulatory Visit (INDEPENDENT_AMBULATORY_CARE_PROVIDER_SITE_OTHER): Payer: Commercial Managed Care - PPO | Admitting: Family Medicine

## 2012-08-28 VITALS — BP 146/85 | HR 78 | Resp 14 | Ht 60.0 in | Wt 187.0 lb

## 2012-08-28 DIAGNOSIS — Z0000 Encounter for general adult medical examination without abnormal findings: Secondary | ICD-10-CM

## 2012-08-28 DIAGNOSIS — E119 Type 2 diabetes mellitus without complications: Secondary | ICD-10-CM

## 2012-08-28 DIAGNOSIS — I10 Essential (primary) hypertension: Secondary | ICD-10-CM

## 2012-08-28 MED ORDER — ESOMEPRAZOLE MAGNESIUM 40 MG PO CPDR: 40.0000 mg | DELAYED_RELEASE_CAPSULE | Freq: Every day | ORAL | Status: DC

## 2012-08-28 MED ORDER — HYDROCHLOROTHIAZIDE 25 MG PO TABS: 25.0000 mg | ORAL_TABLET | Freq: Every day | ORAL | Status: DC

## 2012-08-28 MED ORDER — SERTRALINE HCL 50 MG PO TABS: 50.0000 mg | ORAL_TABLET | Freq: Every day | ORAL | Status: DC

## 2012-08-28 NOTE — Progress Notes (Signed)
Subjective:    Patient ID: Kristen Bright, female    DOB: 1966-02-02, 46 y.o.   MRN: [id number]  HPI HTn - Most of the time BP is well controlled. Checks it at work regularly. Out of her med for 6 days as well. Says also has been eating salt as well  No CP or SOB.     Review of Systems     Objective:   Physical Exam        Assessment & Plan:  HTN- uncontrolled but out of med x 1 weeks. REcheck at f/u in 2 months.  Restart med. Continue work on low-salt diet and regular exercise and weight loss.   Subjective:     Kristen Bright is a 46 y.o. female and is here for a comprehensive physical exam. The patient reports no problems.  She is doing and adjust her Pap smear. She says she did have her mammogram this year.  History   Social History  . Marital Status: Married    Spouse Name: Kathlene November    Number of Children: N/A  . Years of Education: N/A   Occupational History  .      [employer]   Social History Main Topics  . Smoking status: Former Smoker    Types: Cigarettes  . Smokeless tobacco: Not on file     Comment: Says smoked socially for about 3 years when younger  . Alcohol Use: Yes     Comment: rarely  . Drug Use: Yes  . Sexually Active: Yes -- Female partner(s)   Other Topics Concern  . Not on file   Social History Narrative   Some exercise.     Health Maintenance  Topic Date Due  . Influenza Vaccine  04/29/2012  . Pap Smear  07/29/2013  . Tetanus/tdap  08/30/2019    The following portions of the patient's history were reviewed and updated as appropriate: allergies, current medications, past family history, past medical history, past social history, past surgical history and problem list.  Review of Systems A comprehensive review of systems was negative.   Objective:    BP 146/85  Pulse 78  Resp 14  Ht 5' (1.524 m)  Wt 187 lb (84.823 kg)  BMI 36.52 kg/m2  SpO2 99%  LMP 08/21/2012 General appearance: alert, cooperative and appears stated age Head:  Normocephalic, without obvious abnormality, atraumatic Eyes: conj clear, EOMi, PEELA Ears: normal TM's and external ear canals both ears Nose: Nares normal. Septum midline. Mucosa normal. No drainage or sinus tenderness. Throat: lips, mucosa, and tongue normal; teeth and gums normal Neck: no adenopathy, no carotid bruit, no JVD, supple, symmetrical, trachea midline and thyroid not enlarged, symmetric, no tenderness/mass/nodules Back: symmetric, no curvature. ROM normal. No CVA tenderness. Lungs: clear to auscultation bilaterally Heart: regular rate and rhythm, S1, S2 normal, no murmur, click, rub or gallop Abdomen: soft, non-tender; bowel sounds normal; no masses,  no organomegaly Extremities: extremities normal, atraumatic, no cyanosis or edema Pulses: 2+ and symmetric Skin: Skin color, texture, turgor normal. No rashes or lesions Lymph nodes: Cervical, supraclavicular, and axillary nodes normal. Neurologic: Alert and oriented X 3, normal strength and tone. Normal symmetric reflexes. Normal coordination and gait    Assessment:    Healthy female exam.      Plan:     See After Visit Summary for Counseling Recommendations  Keep up a regular exercise program and make sure you are eating a healthy diet Try to eat 4 servings of dairy a day,  or if you are lactose intolerant take a calcium with vitamin D daily.  Your vaccines are up to date.  Reviewed labs with her. Given copy.   Abnromal glucose - Check ed A1C today.   DM- New DX.  A1C is 6.5 today.  Discussed options in her diagnosis. We discussed starting potentially metformin as it has been used for diabetics and impaired fasting glucose. At this point time she really wants to work on diet, exercise, weight loss. She's thinking about getting back on Weight Watchers and then follow back up in 3 months to repeat her hemoglobin A1c. We discussed about motivation to get her back on track. She's actually lost about 20 pounds last year on  Weight Watchers and was doing fantastic and that she felt better at that time.  Hypertension-blood pressure is significantly elevated today. We will get her back on her hydrochlorothiazide. New perception sent to pharmacy. Make sure eating low salt diet and get back to working on diet, weight loss, and regular exercise. Followup in 3 months. She says she will keep an eye on it in between now and then and will call me if the levels stay elevated.

## 2012-09-17 ENCOUNTER — Encounter: Payer: Self-pay | Admitting: Physician Assistant

## 2012-09-17 ENCOUNTER — Ambulatory Visit (INDEPENDENT_AMBULATORY_CARE_PROVIDER_SITE_OTHER): Payer: Commercial Managed Care - PPO | Admitting: Physician Assistant

## 2012-09-17 VITALS — BP 138/87 | HR 82 | Temp 98.4°F | Wt 177.0 lb

## 2012-09-17 DIAGNOSIS — R300 Dysuria: Secondary | ICD-10-CM

## 2012-09-17 DIAGNOSIS — N390 Urinary tract infection, site not specified: Secondary | ICD-10-CM

## 2012-09-17 DIAGNOSIS — M549 Dorsalgia, unspecified: Secondary | ICD-10-CM

## 2012-09-17 LAB — POCT URINALYSIS DIPSTICK
Ketones, UA: NEGATIVE
Protein, UA: NEGATIVE
Spec Grav, UA: 1.01
Urobilinogen, UA: 0.2
pH, UA: 6

## 2012-09-17 MED ORDER — CIPROFLOXACIN HCL 500 MG PO TABS: 500.0000 mg | ORAL_TABLET | Freq: Two times a day (BID) | ORAL | Status: DC

## 2012-09-17 NOTE — Progress Notes (Signed)
  Subjective:    Patient ID: Kristen Bright, female    DOB: 1966-07-03, 47 y.o.   MRN: [id number]  HPI Patient presents to the clinic with 1 and 1/2 weeks of burning with urination and urinary discomfort.  She has not had a UTI in over a year. She has a lot of urinary frequency. Denies any blood or urinary odor. No fever, chills, N/V/D. Today she started having bilateral back pain below her bra line and started to worry. Her bowel movements have been normal. She has tried Azo, cranberry tables and drinking more water. Her symptoms remain the same.      Review of Systems     Objective:   Physical Exam  Constitutional: She is oriented to person, place, and time. She appears well-developed and well-nourished.  HENT:  Head: Normocephalic and atraumatic.  Cardiovascular: Normal rate, regular rhythm and normal heart sounds.   Pulmonary/Chest: Effort normal and breath sounds normal.       No CVA tenderness.  Neurological: She is alert and oriented to person, place, and time.  Skin: Skin is warm and dry.  Psychiatric: She has a normal mood and affect. Her behavior is normal.          Assessment & Plan:  Dysuria/UTI- UA positive for leukocytes. Since symptomatic did treat for UTI with cipro. Will send for culture. Handout given for symptomatic care. Call office if not improving or worsening in 24 hours.

## 2012-09-17 NOTE — Patient Instructions (Addendum)
Urinary Tract Infection Urinary tract infections (UTIs) can develop anywhere along your urinary tract. Your urinary tract is your body's drainage system for removing wastes and extra water. Your urinary tract includes two kidneys, two ureters, a bladder, and a urethra. Your kidneys are a pair of bean-shaped organs. Each kidney is about the size of your fist. They are located below your ribs, one on each side of your spine. CAUSES Infections are caused by microbes, which are microscopic organisms, including fungi, viruses, and bacteria. These organisms are so small that they can only be seen through a microscope. Bacteria are the microbes that most commonly cause UTIs. SYMPTOMS  Symptoms of UTIs may vary by age and gender of the patient and by the location of the infection. Symptoms in young women typically include a frequent and intense urge to urinate and a painful, burning feeling in the bladder or urethra during urination. Older women and men are more likely to be tired, shaky, and weak and have muscle aches and abdominal pain. A fever may mean the infection is in your kidneys. Other symptoms of a kidney infection include pain in your back or sides below the ribs, nausea, and vomiting. DIAGNOSIS To diagnose a UTI, your caregiver will ask you about your symptoms. Your caregiver also will ask to provide a urine sample. The urine sample will be tested for bacteria and white blood cells. White blood cells are made by your body to help fight infection. TREATMENT  Typically, UTIs can be treated with medication. Because most UTIs are caused by a bacterial infection, they usually can be treated with the use of antibiotics. The choice of antibiotic and length of treatment depend on your symptoms and the type of bacteria causing your infection. HOME CARE INSTRUCTIONS  If you were prescribed antibiotics, take them exactly as your caregiver instructs you. Finish the medication even if you feel better after you  have only taken some of the medication.  Drink enough water and fluids to keep your urine clear or pale yellow.  Avoid caffeine, tea, and carbonated beverages. They tend to irritate your bladder.  Empty your bladder often. Avoid holding urine for long periods of time.  Empty your bladder before and after sexual intercourse.  After a bowel movement, women should cleanse from front to back. Use each tissue only once. SEEK MEDICAL CARE IF:   You have back pain.  You develop a fever.  Your symptoms do not begin to resolve within 3 days. SEEK IMMEDIATE MEDICAL CARE IF:   You have severe back pain or lower abdominal pain.  You develop chills.  You have nausea or vomiting.  You have continued burning or discomfort with urination. MAKE SURE YOU:   Understand these instructions.  Will watch your condition.  Will get help right away if you are not doing well or get worse. Document Released: 05/25/2005 Document Revised: 02/14/2012 Document Reviewed: 09/23/2011 Exit[employer] Patient Information 2013 [employer].  

## 2013-05-28 ENCOUNTER — Encounter: Payer: Self-pay | Admitting: Family Medicine

## 2013-05-28 ENCOUNTER — Ambulatory Visit (INDEPENDENT_AMBULATORY_CARE_PROVIDER_SITE_OTHER): Payer: 59 | Admitting: Family Medicine

## 2013-05-28 VITALS — BP 118/85 | HR 98 | Wt 176.0 lb

## 2013-05-28 DIAGNOSIS — R7301 Impaired fasting glucose: Secondary | ICD-10-CM | POA: Insufficient documentation

## 2013-05-28 NOTE — Progress Notes (Signed)
  Subjective:    Patient ID: Kristen Bright, female    DOB: 03-29-1966, 47 y.o.   MRN: [id number]  HPI IFG- has been really working at her diet and exercise.  She has lost 10 lbs.  She has been cutting out desserts.  Has cut back on breads. Bp running mostly in the 120s when she checks it at work and home.  Only uses HCTZ prn for swelling.     Review of Systems     Objective:   Physical Exam  Constitutional: She is oriented to person, place, and time. She appears well-developed and well-nourished.  HENT:  Head: Normocephalic and atraumatic.  Cardiovascular: Normal rate, regular rhythm and normal heart sounds.   Pulmonary/Chest: Effort normal and breath sounds normal.  Neurological: She is alert and oriented to person, place, and time.  Skin: Skin is warm and dry.  Psychiatric: She has a normal mood and affect. Her behavior is normal.          Assessment & Plan:  IFG - her A1C is back down with diet and exercise. She is on a fantastic job. F/U in 6 months. There is some evidence fo cinnamon. We also dicussed that metformin can be used in him. Fasting glucose to slow the progression to diabetes. She also continued to work on diet and exercise and followup in 6 months.  Elevated BP - home BPs have been great.

## 2013-06-03 ENCOUNTER — Telehealth: Payer: Self-pay

## 2013-06-03 NOTE — Telephone Encounter (Signed)
Patient called and left a message on nurse line asking for a return call.   Returned Call: Left message asking patient to call back.  

## 2013-06-25 ENCOUNTER — Other Ambulatory Visit: Payer: Self-pay | Admitting: General Practice

## 2013-06-25 ENCOUNTER — Ambulatory Visit (INDEPENDENT_AMBULATORY_CARE_PROVIDER_SITE_OTHER): Payer: 59

## 2013-06-25 DIAGNOSIS — M545 Low back pain: Secondary | ICD-10-CM

## 2013-06-25 DIAGNOSIS — R52 Pain, unspecified: Secondary | ICD-10-CM

## 2013-06-25 DIAGNOSIS — M25519 Pain in unspecified shoulder: Secondary | ICD-10-CM

## 2013-07-04 ENCOUNTER — Other Ambulatory Visit: Payer: Self-pay

## 2013-09-18 ENCOUNTER — Ambulatory Visit (INDEPENDENT_AMBULATORY_CARE_PROVIDER_SITE_OTHER): Payer: 59 | Admitting: Physician Assistant

## 2013-09-18 ENCOUNTER — Encounter: Payer: Self-pay | Admitting: Physician Assistant

## 2013-09-18 VITALS — BP 138/82 | HR 96 | Wt 184.0 lb

## 2013-09-18 DIAGNOSIS — R5381 Other malaise: Secondary | ICD-10-CM

## 2013-09-18 DIAGNOSIS — R5383 Other fatigue: Secondary | ICD-10-CM

## 2013-09-18 DIAGNOSIS — R011 Cardiac murmur, unspecified: Secondary | ICD-10-CM

## 2013-09-18 LAB — POCT HEMOGLOBIN: HEMOGLOBIN: 11.3 g/dL — AB (ref 12.2–16.2)

## 2013-09-19 LAB — VITAMIN B12: VITAMIN B 12: 645 pg/mL (ref 211–911)

## 2013-09-19 LAB — TSH: TSH: 1.691 u[IU]/mL (ref 0.350–4.500)

## 2013-09-19 LAB — VITAMIN D 25 HYDROXY (VIT D DEFICIENCY, FRACTURES): Vit D, 25-Hydroxy: 56 ng/mL (ref 30–89)

## 2013-09-19 LAB — FERRITIN: Ferritin: 12 ng/mL (ref 10–291)

## 2013-09-19 NOTE — Progress Notes (Addendum)
   Subjective:    Patient ID: Kristen Bright, female    DOB: 1965-11-24, 48 y.o.   MRN: [id number]  HPI Pt is a 48 yo female who presents to the clinic with fatigue. Pt has no energy. She just almost feels like she cannot make it at times. She loves her job and denies any depression. She does snore and feels like she wakes up a lot. She wakes up tired. She does not exercise. She does try exercise but admits to not doing anything regularly. Had a fatigue panel in 2013 and positive for anemia. Not currently taking any iron supplements. She just wants to sit and rest. Denies any swelling or edema. She admits to being more short of breath.    Review of Systems     Objective:   Physical Exam  Constitutional: She is oriented to person, place, and time. She appears well-developed and well-nourished.  Overweight.   HENT:  Head: Normocephalic and atraumatic.  Cardiovascular: Normal rate and regular rhythm.   Murmur heard. 9-5/2 systolic ejection murmur best heard over pulmonic area.   Pulmonary/Chest: Effort normal and breath sounds normal. She has no wheezes.  Neurological: She is alert and oriented to person, place, and time.  Skin: Skin is warm and dry.  Psychiatric: She has a normal mood and affect. Her behavior is normal.          Assessment & Plan:  Fatigue- HgB- 11.3. Go ahead and start ferrous sulfate 325mg  once a day. Colace for stool softening if constipation occurs. will check tSH, vitamin D, b12, CBC, ferritin. Will get set up for sleep apnea home test. Follow up in 6 weeks with PCP. Will call with labs. Discussed regular exercise how can help with sleep. Melatonin also is very beneficial at bedtime up to 10mg .    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 I feel like I heard a slight systolic murmur today. I would like to get echo to further evaluate.

## 2013-09-19 NOTE — Addendum Note (Signed)
Addended by: Donella Stade on: 09/19/2013 02:52 PM   Modules accepted: Orders

## 2013-09-20 ENCOUNTER — Other Ambulatory Visit (HOSPITAL_COMMUNITY): Payer: Self-pay | Admitting: Cardiology

## 2013-09-20 DIAGNOSIS — R011 Cardiac murmur, unspecified: Secondary | ICD-10-CM

## 2013-09-23 ENCOUNTER — Encounter: Payer: Self-pay | Admitting: Cardiology

## 2013-09-23 ENCOUNTER — Ambulatory Visit (HOSPITAL_COMMUNITY): Payer: 59 | Attending: Cardiology | Admitting: Cardiology

## 2013-09-23 DIAGNOSIS — R5383 Other fatigue: Secondary | ICD-10-CM

## 2013-09-23 DIAGNOSIS — R011 Cardiac murmur, unspecified: Secondary | ICD-10-CM

## 2013-09-23 DIAGNOSIS — R0602 Shortness of breath: Secondary | ICD-10-CM

## 2013-09-23 DIAGNOSIS — R5381 Other malaise: Secondary | ICD-10-CM | POA: Insufficient documentation

## 2013-09-23 NOTE — Progress Notes (Signed)
Echo performed. 

## 2013-09-25 ENCOUNTER — Other Ambulatory Visit: Payer: Self-pay | Admitting: Family Medicine

## 2013-09-25 DIAGNOSIS — Z1231 Encounter for screening mammogram for malignant neoplasm of breast: Secondary | ICD-10-CM

## 2013-09-26 ENCOUNTER — Ambulatory Visit (INDEPENDENT_AMBULATORY_CARE_PROVIDER_SITE_OTHER): Payer: 59

## 2013-09-26 DIAGNOSIS — Z1231 Encounter for screening mammogram for malignant neoplasm of breast: Secondary | ICD-10-CM

## 2013-09-30 ENCOUNTER — Telehealth: Payer: Self-pay | Admitting: Family Medicine

## 2013-09-30 ENCOUNTER — Encounter: Payer: Self-pay | Admitting: Family Medicine

## 2013-09-30 DIAGNOSIS — G4730 Sleep apnea, unspecified: Secondary | ICD-10-CM | POA: Insufficient documentation

## 2013-09-30 DIAGNOSIS — G4733 Obstructive sleep apnea (adult) (pediatric): Secondary | ICD-10-CM | POA: Insufficient documentation

## 2013-09-30 NOTE — Telephone Encounter (Signed)
Call pt: Please have her schedule appt ot go over results so sleep study

## 2013-10-01 NOTE — Telephone Encounter (Signed)
Pt called and left message that she will need to schedule an appt to review results of sleep study.Kristen Bright

## 2013-10-02 ENCOUNTER — Encounter: Payer: Self-pay | Admitting: Physician Assistant

## 2013-10-02 ENCOUNTER — Ambulatory Visit (HOSPITAL_BASED_OUTPATIENT_CLINIC_OR_DEPARTMENT_OTHER): Payer: 59

## 2013-10-02 ENCOUNTER — Ambulatory Visit (INDEPENDENT_AMBULATORY_CARE_PROVIDER_SITE_OTHER): Payer: 59 | Admitting: Physician Assistant

## 2013-10-02 VITALS — BP 125/80 | HR 79 | Wt 183.0 lb

## 2013-10-02 DIAGNOSIS — G4733 Obstructive sleep apnea (adult) (pediatric): Secondary | ICD-10-CM

## 2013-10-02 DIAGNOSIS — R7989 Other specified abnormal findings of blood chemistry: Secondary | ICD-10-CM

## 2013-10-02 DIAGNOSIS — R790 Abnormal level of blood mineral: Secondary | ICD-10-CM | POA: Insufficient documentation

## 2013-10-02 DIAGNOSIS — D509 Iron deficiency anemia, unspecified: Secondary | ICD-10-CM | POA: Insufficient documentation

## 2013-10-02 DIAGNOSIS — R030 Elevated blood-pressure reading, without diagnosis of hypertension: Secondary | ICD-10-CM

## 2013-10-02 MED ORDER — HYDROCHLOROTHIAZIDE 25 MG PO TABS: 25.0000 mg | ORAL_TABLET | Freq: Every day | ORAL | Status: DC

## 2013-10-02 NOTE — Progress Notes (Signed)
   Subjective:    Patient ID: Kristen Bright, female    DOB: Jan 28, 1966, 48 y.o.   MRN: [id number]  HPI  Patient is a 48 show female who presents to the clinic to go over sleep study results. She would also like to continue on her HCTZ for blood pressure. She denies any chest pains, palpitations, vision changes, headaches, dizziness. She's been taking HCTZ daily. She finds that when she does not take it her blood pressure can range from 120-140/75-95.  Review of Systems     Objective:   Physical Exam  Constitutional: She appears well-developed and well-nourished.  HENT:  Head: Normocephalic and atraumatic.  Right Ear: External ear normal.  Left Ear: External ear normal.  Cardiovascular: Normal rate, regular rhythm and normal heart sounds.   Pulmonary/Chest: Effort normal and breath sounds normal.  Psychiatric: She has a normal mood and affect. Her behavior is normal.          Assessment & Plan:  OSA- discuss with patient results that were positive for mild sleep apnea with significant episodes of oxygen desaturation. Will refer her to try a respiratory for placement of CPAP. Patient will continue on ferrous sulfate for low ferritin stores/IDA. Hopefully with iron and CPAP patient's fatigue will improve.F/U in the next 3 months to discuss improvement with PCP>   Elevated blood pressure without diagnosis of hypertension-patient will continue on HCTZ daily.

## 2013-10-02 NOTE — Patient Instructions (Signed)
Will refer to CPAP.

## 2013-10-03 ENCOUNTER — Telehealth: Payer: Self-pay | Admitting: *Deleted

## 2013-10-03 MED ORDER — AMBULATORY NON FORMULARY MEDICATION: Status: DC

## 2013-10-03 NOTE — Telephone Encounter (Signed)
CPAP rx ordered

## 2013-10-09 ENCOUNTER — Other Ambulatory Visit: Payer: Self-pay | Admitting: *Deleted

## 2013-10-09 MED ORDER — AMBULATORY NON FORMULARY MEDICATION: Status: DC

## 2014-05-07 ENCOUNTER — Ambulatory Visit (INDEPENDENT_AMBULATORY_CARE_PROVIDER_SITE_OTHER): Payer: 59 | Admitting: Physician Assistant

## 2014-05-07 ENCOUNTER — Encounter: Payer: Self-pay | Admitting: Physician Assistant

## 2014-05-07 VITALS — BP 123/76 | HR 81 | Ht 60.0 in | Wt 178.0 lb

## 2014-05-07 DIAGNOSIS — R790 Abnormal level of blood mineral: Secondary | ICD-10-CM

## 2014-05-07 DIAGNOSIS — D509 Iron deficiency anemia, unspecified: Secondary | ICD-10-CM

## 2014-05-07 DIAGNOSIS — I10 Essential (primary) hypertension: Secondary | ICD-10-CM

## 2014-05-07 DIAGNOSIS — F411 Generalized anxiety disorder: Secondary | ICD-10-CM

## 2014-05-07 DIAGNOSIS — R7989 Other specified abnormal findings of blood chemistry: Secondary | ICD-10-CM

## 2014-05-07 LAB — POCT HEMOGLOBIN: Hemoglobin: 12.6 g/dL (ref 12.2–16.2)

## 2014-05-07 MED ORDER — SERTRALINE HCL 50 MG PO TABS: 50.0000 mg | ORAL_TABLET | Freq: Every day | ORAL | Status: DC

## 2014-05-07 MED ORDER — ESOMEPRAZOLE MAGNESIUM 40 MG PO CPDR: 40.0000 mg | DELAYED_RELEASE_CAPSULE | Freq: Every day | ORAL | Status: DC

## 2014-05-07 MED ORDER — HYDROCHLOROTHIAZIDE 25 MG PO TABS: 25.0000 mg | ORAL_TABLET | Freq: Every day | ORAL | Status: DC

## 2014-05-07 MED ORDER — FUSION PLUS PO CAPS: ORAL_CAPSULE | ORAL | Status: DC

## 2014-05-07 NOTE — Patient Instructions (Signed)
Iron-Rich Diet An iron-rich diet contains foods that are good sources of iron. Iron is an important mineral that helps your body produce hemoglobin. Hemoglobin is a protein in red blood cells that carries oxygen to the body's tissues. Sometimes, the iron level in your blood can be low. This may be caused by:  A lack of iron in your diet.  Blood loss.  Times of growth, such as during pregnancy or during a child's growth and development. Low levels of iron can cause a decrease in the number of red blood cells. This can result in iron deficiency anemia. Iron deficiency anemia symptoms include:  Tiredness.  Weakness.  Irritability.  Increased chance of infection. Here are some recommendations for daily iron intake:  Males older than 47 years of age need 8 mg of iron per day.  Women ages 19 to 50 need 18 mg of iron per day.  Pregnant women need 27 mg of iron per day, and women who are over 19 years of age and breastfeeding need 9 mg of iron per day.  Women over the age of 50 need 8 mg of iron per day. SOURCES OF IRON There are 2 types of iron that are found in food: heme iron and nonheme iron. Heme iron is absorbed by the body better than nonheme iron. Heme iron is found in meat, poultry, and fish. Nonheme iron is found in grains, beans, and vegetables. Heme Iron Sources Food / Iron (mg)  Chicken liver, 3 oz (85 g)/ 10 mg  Beef liver, 3 oz (85 g)/ 5.5 mg  Oysters, 3 oz (85 g)/ 8 mg  Beef, 3 oz (85 g)/ 2 to 3 mg  Shrimp, 3 oz (85 g)/ 2.8 mg  Turkey, 3 oz (85 g)/ 2 mg  Chicken, 3 oz (85 g) / 1 mg  Fish (tuna, halibut), 3 oz (85 g)/ 1 mg  Pork, 3 oz (85 g)/ 0.9 mg Nonheme Iron Sources Food / Iron (mg)  Ready-to-eat breakfast cereal, iron-fortified / 3.9 to 7 mg  Tofu,  cup / 3.4 mg  Kidney beans,  cup / 2.6 mg  Baked potato with skin / 2.7 mg  Asparagus,  cup / 2.2 mg  Avocado / 2 mg  Dried peaches,  cup / 1.6 mg  Raisins,  cup / 1.5 mg  Soy milk, 1 cup  / 1.5 mg  Whole-wheat bread, 1 slice / 1.2 mg  Spinach, 1 cup / 0.8 mg  Broccoli,  cup / 0.6 mg IRON ABSORPTION Certain foods can decrease the body's absorption of iron. Try to avoid these foods and beverages while eating meals with iron-containing foods:  Coffee.  Tea.  Fiber.  Soy. Foods containing vitamin C can help increase the amount of iron your body absorbs from iron sources, especially from nonheme sources. Eat foods with vitamin C along with iron-containing foods to increase your iron absorption. Foods that are high in vitamin C include many fruits and vegetables. Some good sources are:  Fresh orange juice.  Oranges.  Strawberries.  Mangoes.  Grapefruit.  Red bell peppers.  Green bell peppers.  Broccoli.  Potatoes with skin.  Tomato juice. Document Released: 03/29/2005 Document Revised: 11/07/2011 Document Reviewed: 02/03/2011 Exit[employer] Patient Information 2015 [employer]. This information is not intended to replace advice given to you by your health care provider. Make sure you discuss any questions you have with your health care provider.  

## 2014-05-10 NOTE — Progress Notes (Signed)
   Subjective:    Patient ID: Kristen Bright, female    DOB: 03/28/1966, 48 y.o.   MRN: [id number]  HPI Pt presents to the clinic for med refill.   HTN- pt was started on HCTZ. She had borderline BP. She try to take just as needed but noticed BP would go up. She has started to take daily and feels the best with controlled BP. Denies any CP, palpitations, SOB, HA's.   Anxiety/Mood disorder- controlled on zoloft. Needs refill.   Low iron stores/anemia- last checked at [facility] and per pt was around 10. She has started iron once a day. She had too much constipation with twice a day. She does feel some better.   OSA- she never went through with CPAP. She feels like she is much better with iron supplementation.   Review of Systems  All other systems reviewed and are negative.      Objective:   Physical Exam  Constitutional: She is oriented to person, place, and time. She appears well-developed and well-nourished.  HENT:  Head: Normocephalic and atraumatic.  Cardiovascular: Normal rate, regular rhythm and normal heart sounds.   Pulmonary/Chest: Effort normal and breath sounds normal.  Neurological: She is alert and oriented to person, place, and time.  Skin: Skin is dry.  Psychiatric: She has a normal mood and affect. Her behavior is normal.          Assessment & Plan:  HTN- continue on HCTZ daily. Refilled for 6 months.   Anxiety/mood disorder- refilled zoloft.   Anemia/low iron stores- .Marland Kitchen Results for orders placed in visit on 05/07/14  POCT HEMOGLOBIN      Result Value Ref Range   Hemoglobin 12.6  12.2 - 16.2 g/dL   Continue on iron once daily. Prescribed fusion iron with probiotic to see if easier on stomach.   OSA- let us know if would like to get fitted for CPAP.

## 2014-06-03 ENCOUNTER — Encounter: Payer: 59 | Admitting: Family Medicine

## 2014-06-24 ENCOUNTER — Ambulatory Visit (INDEPENDENT_AMBULATORY_CARE_PROVIDER_SITE_OTHER): Payer: 59 | Admitting: Family Medicine

## 2014-06-24 ENCOUNTER — Encounter: Payer: Self-pay | Admitting: Family Medicine

## 2014-06-24 VITALS — BP 121/84 | HR 78 | Ht 60.0 in | Wt 180.0 lb

## 2014-06-24 DIAGNOSIS — R011 Cardiac murmur, unspecified: Secondary | ICD-10-CM | POA: Insufficient documentation

## 2014-06-24 DIAGNOSIS — R7301 Impaired fasting glucose: Secondary | ICD-10-CM

## 2014-06-24 DIAGNOSIS — G4733 Obstructive sleep apnea (adult) (pediatric): Secondary | ICD-10-CM

## 2014-06-24 DIAGNOSIS — D509 Iron deficiency anemia, unspecified: Secondary | ICD-10-CM

## 2014-06-24 DIAGNOSIS — Z0000 Encounter for general adult medical examination without abnormal findings: Secondary | ICD-10-CM

## 2014-06-24 DIAGNOSIS — Z0189 Encounter for other specified special examinations: Secondary | ICD-10-CM

## 2014-06-24 LAB — POCT GLYCOSYLATED HEMOGLOBIN (HGB A1C): Hemoglobin A1C: 5.9

## 2014-06-24 NOTE — Assessment & Plan Note (Signed)
Stable. Recheck in 6 months. Continue to work on diet and exercise and weight loss

## 2014-06-24 NOTE — Progress Notes (Signed)
Subjective:     Kristen Bright is a 48 y.o. female and is here for a comprehensive physical exam. The patient reports problems - still taking iron daily. She is iron deficient from frequent heavy periods.  she did have to decrease from 2 tabs 1 tab because of constipation but so far doing okay with 1 pill a day. Lab Results  Component Value Date   WBC 6.8 03/13/2012   HGB 12.6 05/07/2014   HCT 35.9* 03/13/2012   MCV 79.8 03/13/2012   PLT 305 03/13/2012     History   Social History  . Marital Status: Married    Spouse Name: Ronalee Belts    Number of Children: N/A  . Years of Education: N/A   Occupational History  .      [employer]   Social History Main Topics  . Smoking status: Former Smoker    Types: Cigarettes  . Smokeless tobacco: Not on file     Comment: Says smoked socially for about 3 years when younger  . Alcohol Use: Yes     Comment: rarely  . Drug Use: Yes  . Sexual Activity: Yes    Partners: Male   Other Topics Concern  . Not on file   Social History Narrative   Some exercise.     Health Maintenance  Topic Date Due  . Influenza Vaccine  03/30/2015  . Pap Smear  05/06/2017  . Tetanus/tdap  08/30/2019    The following portions of the patient's history were reviewed and updated as appropriate: allergies, current medications, past family history, past medical history, past social history, past surgical history and problem list.  Review of Systems Review of systems not obtained due to patient factors.   Objective:    BP 121/84  Pulse 78  Ht 5' (1.524 m)  Wt 180 lb (81.647 kg)  BMI 35.15 kg/m2 General appearance: alert, cooperative and appears stated age Head: Normocephalic, without obvious abnormality, atraumatic Eyes: conj clear EOMI, PEERLA Ears: normal TM's and external ear canals both ears Nose: Nares normal. Septum midline. Mucosa normal. No drainage or sinus tenderness. Throat: lips, mucosa, and tongue normal; teeth and gums normal Neck: no  adenopathy, no carotid bruit, no JVD, supple, symmetrical, trachea midline and thyroid not enlarged, symmetric, no tenderness/mass/nodules Back: symmetric, no curvature. ROM normal. No CVA tenderness. Lungs: clear to auscultation bilaterally Heart: regular rate and rhythm, S1, S2 normal, no murmur, click, rub or gallop Abdomen: soft, non-tender; bowel sounds normal; no masses,  no organomegaly Extremities: extremities normal, atraumatic, no cyanosis or edema Pulses: 2+ and symmetric Skin: Skin color, texture, turgor normal. No rashes or lesions Lymph nodes: Cervical, supraclavicular, and axillary nodes normal. Neurologic: Alert and oriented X 3, normal strength and tone. Normal symmetric reflexes. Normal coordination and gait    Assessment:    Healthy female exam.      Plan:     See After Visit Summary for Counseling Recommendations  Keep up a regular exercise program and make sure you are eating a healthy diet Try to eat 4 servings of dairy a day, or if you are lactose intolerant take a calcium with vitamin D daily.  Your vaccines are up to date.   She is no longer on sertraline. She felt like her mood was doing well so she stopped it.  Says may restart it around the Holidays if you gets stressed.    OSA - she was tested in February with a home sleep test. She thinks it may  have had some inaccuracies. She mostly slept on her back that night which is not her typical sleep position. The mask and device itself actually had a smoke/cigarette smell to it. She has been using some Breathe Right strips and that has been helpful to control her snoring. Recommend repeat the testing sometime next year, we will use the [facility]. If she does have sleep apnea that more moderate or severe than we definitely need to address this and treat this.  Heart murmur - had echo in January. She wanted to go over resuls today.  Copy provided.

## 2014-06-24 NOTE — Patient Instructions (Signed)
Keep up a regular exercise program and make sure you are eating a healthy diet Try to eat 4 servings of dairy a day, or if you are lactose intolerant take a calcium with vitamin D daily.  Your vaccines are up to date.   

## 2014-07-03 LAB — COMPLETE METABOLIC PANEL WITH GFR
ALT: 36 U/L — ABNORMAL HIGH (ref 0–35)
AST: 33 U/L (ref 0–37)
Albumin: 4.3 g/dL (ref 3.5–5.2)
Alkaline Phosphatase: 55 U/L (ref 39–117)
BILIRUBIN TOTAL: 0.6 mg/dL (ref 0.2–1.2)
BUN: 14 mg/dL (ref 6–23)
CO2: 27 mEq/L (ref 19–32)
Calcium: 9.2 mg/dL (ref 8.4–10.5)
Chloride: 103 mEq/L (ref 96–112)
Creat: 0.74 mg/dL (ref 0.50–1.10)
GLUCOSE: 99 mg/dL (ref 70–99)
Potassium: 4.3 mEq/L (ref 3.5–5.3)
Sodium: 139 mEq/L (ref 135–145)
Total Protein: 6.9 g/dL (ref 6.0–8.3)

## 2014-07-03 LAB — LIPID PANEL
Cholesterol: 207 mg/dL — ABNORMAL HIGH (ref 0–200)
HDL: 53 mg/dL (ref >39)
LDL Cholesterol: 123 mg/dL — ABNORMAL HIGH (ref 0–99)
Total CHOL/HDL Ratio: 3.9 Ratio
Triglycerides: 155 mg/dL — ABNORMAL HIGH (ref <150)
VLDL: 31 mg/dL (ref 0–40)

## 2014-07-03 LAB — FERRITIN: FERRITIN: 17 ng/mL (ref 10–291)

## 2014-07-03 LAB — CBC
HEMATOCRIT: 39.1 % (ref 36.0–46.0)
HEMOGLOBIN: 12.6 g/dL (ref 12.0–15.0)
MCH: 27.5 pg (ref 26.0–34.0)
MCHC: 32.2 g/dL (ref 30.0–36.0)
MCV: 85.2 fL (ref 78.0–100.0)
Platelets: 293 10*3/uL (ref 150–400)
RBC: 4.59 MIL/uL (ref 3.87–5.11)
RDW: 14.9 % (ref 11.5–15.5)
WBC: 6.5 10*3/uL (ref 4.0–10.5)

## 2014-07-03 LAB — TSH: TSH: 2.104 u[IU]/mL (ref 0.350–4.500)

## 2014-07-22 ENCOUNTER — Other Ambulatory Visit: Payer: Self-pay | Admitting: Family Medicine

## 2014-07-22 DIAGNOSIS — Z1231 Encounter for screening mammogram for malignant neoplasm of breast: Secondary | ICD-10-CM

## 2014-08-20 ENCOUNTER — Ambulatory Visit: Payer: 59 | Admitting: Physician Assistant

## 2014-08-20 ENCOUNTER — Ambulatory Visit (INDEPENDENT_AMBULATORY_CARE_PROVIDER_SITE_OTHER): Payer: 59 | Admitting: Physician Assistant

## 2014-08-20 ENCOUNTER — Encounter: Payer: Self-pay | Admitting: Physician Assistant

## 2014-08-20 VITALS — BP 138/85 | HR 82 | Ht 60.0 in | Wt 181.0 lb

## 2014-08-20 DIAGNOSIS — R740 Nonspecific elevation of levels of transaminase and lactic acid dehydrogenase [LDH]: Secondary | ICD-10-CM

## 2014-08-20 DIAGNOSIS — R1310 Dysphagia, unspecified: Secondary | ICD-10-CM

## 2014-08-20 DIAGNOSIS — R7401 Elevation of levels of liver transaminase levels: Secondary | ICD-10-CM

## 2014-08-20 MED ORDER — PREDNISONE 20 MG PO TABS: ORAL_TABLET | ORAL | Status: DC

## 2014-08-20 NOTE — Patient Instructions (Signed)
Start back nexium 7 days.  Prednisone course-  3- schedule neck ultrasound and possible EGD.

## 2014-08-20 NOTE — Progress Notes (Signed)
   Subjective:    Patient ID: Kristen Bright, female    DOB: 06-20-1966, 48 y.o.   MRN: [id number]  HPI  Patient is a 48 year old female who presents to the clinic with trouble swallowing and potential neck swelling. For the last she's been growing.  Episodic sensations. She is usually after she eats where she feels like her neck starts to swell and she is not able to swallow anymore. She denies any choking. She denies any fever. She has tried Benadryl and it did seem to help some. She denies starting any new medications or supplements. She does seem to have more dry mouth. She denies any fever, chills, nausea or vomiting. She denies any overt heartburn symptoms but she does have a history of acid reflux but not currently taking medication for.     Review of Systems  All other systems reviewed and are negative.      Objective:   Physical Exam  Constitutional: She is oriented to person, place, and time. She appears well-developed and well-nourished.  HENT:  Head: Normocephalic and atraumatic.  Right Ear: External ear normal.  Left Ear: External ear normal.  Nose: Nose normal.  Mouth/Throat: Oropharynx is clear and moist. No oropharyngeal exudate.  Eyes: Conjunctivae are normal. Right eye exhibits no discharge. Left eye exhibits no discharge.  Neck: Normal range of motion. Neck supple. No thyromegaly present.  Cardiovascular: Normal rate, regular rhythm and normal heart sounds.   Pulmonary/Chest: Effort normal and breath sounds normal. She has no wheezes.  Lymphadenopathy:    She has no cervical adenopathy.  Neurological: She is alert and oriented to person, place, and time.  Skin: Skin is dry.  Psychiatric: She has a normal mood and affect. Her behavior is normal.          Assessment & Plan:  Elevated ALC-we'll recheck CMP today.  Trouble swallowing-unclear etiology today. Thyroid was recently checked at complete physical in November and normal. I did not feel any thyroid  enlargement on exam today. The symptoms could possibly represent acid reflux. Patient does have a history of acid reflux. Discussed for patient to start back on Nexium daily for the next 7 days. If no improvement I did print off a course of prednisone for her to try and see if symptoms resolve during that period of time. Other etiologies such as esophageal stricture, neck mass, allergies are still in the differential. I do think patient could do a trial of an antihistamine to see if the symptoms resolve. Follow up in the next 2 weeks with PCP for further management until alleviation of symptoms. Could also consider neck ultrasound and EGD to look for esophageal stricture. Warning signs discuss to have emergent care.

## 2014-09-03 ENCOUNTER — Telehealth: Payer: Self-pay | Admitting: *Deleted

## 2014-09-03 NOTE — Telephone Encounter (Signed)
Awesome!

## 2014-09-03 NOTE — Telephone Encounter (Signed)
Pt called and wanted you to know that the nexium has worked really well for her choking spells.

## 2014-09-04 LAB — CBC WITH DIFFERENTIAL/PLATELET
BASOS PCT: 0 % (ref 0–1)
Basophils Absolute: 0 10*3/uL (ref 0.0–0.1)
EOS PCT: 1 % (ref 0–5)
Eosinophils Absolute: 0.1 10*3/uL (ref 0.0–0.7)
HCT: 37.4 % (ref 36.0–46.0)
Hemoglobin: 12.3 g/dL (ref 12.0–15.0)
LYMPHS PCT: 34 % (ref 12–46)
Lymphs Abs: 2.6 10*3/uL (ref 0.7–4.0)
MCH: 27.5 pg (ref 26.0–34.0)
MCHC: 32.9 g/dL (ref 30.0–36.0)
MCV: 83.7 fL (ref 78.0–100.0)
MONO ABS: 0.5 10*3/uL (ref 0.1–1.0)
MPV: 9.9 fL (ref 9.4–12.4)
Monocytes Relative: 7 % (ref 3–12)
NEUTROS ABS: 4.4 10*3/uL (ref 1.7–7.7)
NEUTROS PCT: 58 % (ref 43–77)
Platelets: 288 10*3/uL (ref 150–400)
RBC: 4.47 MIL/uL (ref 3.87–5.11)
RDW: 14.2 % (ref 11.5–15.5)
WBC: 7.5 10*3/uL (ref 4.0–10.5)

## 2014-09-04 LAB — COMPREHENSIVE METABOLIC PANEL
ALK PHOS: 53 U/L (ref 39–117)
ALT: 40 U/L — ABNORMAL HIGH (ref 0–35)
AST: 32 U/L (ref 0–37)
Albumin: 4 g/dL (ref 3.5–5.2)
BILIRUBIN TOTAL: 0.4 mg/dL (ref 0.2–1.2)
BUN: 11 mg/dL (ref 6–23)
CO2: 28 mEq/L (ref 19–32)
Calcium: 9.1 mg/dL (ref 8.4–10.5)
Chloride: 102 mEq/L (ref 96–112)
Creat: 0.56 mg/dL (ref 0.50–1.10)
Glucose, Bld: 93 mg/dL (ref 70–99)
Potassium: 3.9 mEq/L (ref 3.5–5.3)
SODIUM: 139 meq/L (ref 135–145)
TOTAL PROTEIN: 6.8 g/dL (ref 6.0–8.3)

## 2014-09-05 ENCOUNTER — Other Ambulatory Visit: Payer: Self-pay | Admitting: Physician Assistant

## 2014-09-05 DIAGNOSIS — Z1231 Encounter for screening mammogram for malignant neoplasm of breast: Secondary | ICD-10-CM

## 2014-09-05 DIAGNOSIS — R740 Nonspecific elevation of levels of transaminase and lactic acid dehydrogenase [LDH]: Principal | ICD-10-CM

## 2014-09-05 DIAGNOSIS — R7401 Elevation of levels of liver transaminase levels: Secondary | ICD-10-CM

## 2014-09-09 ENCOUNTER — Other Ambulatory Visit: Payer: Self-pay | Admitting: Physician Assistant

## 2014-09-10 ENCOUNTER — Ambulatory Visit (INDEPENDENT_AMBULATORY_CARE_PROVIDER_SITE_OTHER): Payer: 59

## 2014-09-10 ENCOUNTER — Encounter: Payer: Self-pay | Admitting: Physician Assistant

## 2014-09-10 DIAGNOSIS — R748 Abnormal levels of other serum enzymes: Secondary | ICD-10-CM

## 2014-09-10 DIAGNOSIS — R740 Nonspecific elevation of levels of transaminase and lactic acid dehydrogenase [LDH]: Secondary | ICD-10-CM

## 2014-09-10 DIAGNOSIS — K760 Fatty (change of) liver, not elsewhere classified: Secondary | ICD-10-CM | POA: Insufficient documentation

## 2014-09-10 LAB — HEPATITIS PANEL, ACUTE
HCV AB: NEGATIVE
Hep A IgM: NONREACTIVE
Hep B C IgM: NONREACTIVE
Hepatitis B Surface Ag: NEGATIVE

## 2014-09-10 LAB — PROTIME-INR
INR: 1.08 (ref <1.50)
PROTHROMBIN TIME: 14 s (ref 11.6–15.2)

## 2014-09-10 LAB — IRON AND TIBC
%SAT: 13 % — ABNORMAL LOW (ref 20–55)
Iron: 56 ug/dL (ref 42–145)
TIBC: 442 ug/dL (ref 250–470)
UIBC: 386 ug/dL (ref 125–400)

## 2014-10-01 ENCOUNTER — Ambulatory Visit: Payer: 59

## 2014-10-08 ENCOUNTER — Other Ambulatory Visit: Payer: Self-pay | Admitting: Physician Assistant

## 2014-10-08 ENCOUNTER — Other Ambulatory Visit: Payer: Self-pay | Admitting: Family Medicine

## 2014-10-10 NOTE — Telephone Encounter (Signed)
See note from pharmacy. Not on current medication list.

## 2014-11-04 ENCOUNTER — Encounter: Payer: Self-pay | Admitting: Physician Assistant

## 2014-11-04 ENCOUNTER — Ambulatory Visit (INDEPENDENT_AMBULATORY_CARE_PROVIDER_SITE_OTHER): Payer: 59 | Admitting: Physician Assistant

## 2014-11-04 VITALS — BP 127/76 | HR 83 | Ht 60.0 in | Wt 175.0 lb

## 2014-11-04 DIAGNOSIS — E669 Obesity, unspecified: Secondary | ICD-10-CM | POA: Diagnosis not present

## 2014-11-04 DIAGNOSIS — Z6834 Body mass index (BMI) 34.0-34.9, adult: Secondary | ICD-10-CM

## 2014-11-04 DIAGNOSIS — R635 Abnormal weight gain: Secondary | ICD-10-CM | POA: Diagnosis not present

## 2014-11-04 DIAGNOSIS — E6609 Other obesity due to excess calories: Secondary | ICD-10-CM | POA: Insufficient documentation

## 2014-11-04 DIAGNOSIS — Z6836 Body mass index (BMI) 36.0-36.9, adult: Secondary | ICD-10-CM | POA: Insufficient documentation

## 2014-11-04 MED ORDER — PHENTERMINE HCL 37.5 MG PO TABS: 37.5000 mg | ORAL_TABLET | Freq: Every day | ORAL | Status: DC

## 2014-11-04 NOTE — Progress Notes (Signed)
   Subjective:    Patient ID: Kristen Bright, female    DOB: [birth year], 49 y.o.   MRN: [id number]  HPI  Pt presents to the clinic to discuss weight loss. She is desperately trying to lose weight. She wants to reverse fatty liver and make sure she doesn't devleop diabetes. She is on weight watchers but struggling. She has lost 6lbs but feels very "stuck". She has never tried medication for weight loss. She has had evaluation with bariactric clinic but not a candidate for surgery. She has started exercising more with aerobic classes down stairs. She did get a fit bit and walking 10,000-12000 steps a day.     Review of Systems  All other systems reviewed and are negative.      Objective:   Physical Exam  Constitutional: She is oriented to person, place, and time. She appears well-developed and well-nourished.  Obese.   Cardiovascular: Normal rate, regular rhythm and normal heart sounds.   Pulmonary/Chest: Effort normal and breath sounds normal. She has no wheezes.  Neurological: She is alert and oriented to person, place, and time.  Skin: Skin is dry.  Psychiatric: She has a normal mood and affect. Her behavior is normal.          Assessment & Plan:  Obesity/abnormal weight gain- BMI 34. Continue diet and exercise. Continue weight watchers.  Keep calories to 1500 daily. Started phentermine daily. Discussed possible side effects. Decrease to half tab with any side effects. Discussed long term weight loss medication but insurance will not pay for. Follow up in 1 month.

## 2014-12-02 ENCOUNTER — Encounter: Payer: Self-pay | Admitting: Physician Assistant

## 2014-12-02 ENCOUNTER — Ambulatory Visit (INDEPENDENT_AMBULATORY_CARE_PROVIDER_SITE_OTHER): Payer: 59 | Admitting: Physician Assistant

## 2014-12-02 VITALS — BP 133/89 | HR 76 | Wt 165.0 lb

## 2014-12-02 DIAGNOSIS — R635 Abnormal weight gain: Secondary | ICD-10-CM

## 2014-12-02 MED ORDER — PHENTERMINE HCL 37.5 MG PO TABS: 37.5000 mg | ORAL_TABLET | Freq: Every day | ORAL | Status: DC

## 2014-12-02 NOTE — Progress Notes (Signed)
   Subjective:    Patient ID: Kristen Bright, female    DOB: December 11, 1965, 49 y.o.   MRN: [id number]  HPI  Patient is a 49 year old female who presents to the clinic to follow-up on phentermine and weight loss. Patient is doing great on phentermine dose. She denies any side effects of insomnia, anxiety, dry mouth, constipation or palpitations. In fact she feels like she is sleeping better. She has lost 7 inches and 10 pounds in the last month. She is counting her calories via Weight Watchers. She is walking some but is aware she needs to increase that. She is trying to get 10,000 steps a day.    Review of Systems  All other systems reviewed and are negative.      Objective:   Physical Exam  Constitutional: She is oriented to person, place, and time. She appears well-developed and well-nourished.  HENT:  Head: Normocephalic and atraumatic.  Cardiovascular: Normal rate, regular rhythm and normal heart sounds.   Pulmonary/Chest: Effort normal and breath sounds normal.  Neurological: She is alert and oriented to person, place, and time.  Skin: Skin is dry.  Psychiatric: She has a normal mood and affect. Her behavior is normal.          Assessment & Plan:  Abnormal weight gain/obesity-refilled phentermine for 3 months. Patient is doing wonderfully. Her vital signs are great. She has had a wonderful response with no side effects. Discussed increase exercise to aid in weight loss. Follow-up in 3 months.

## 2015-04-01 ENCOUNTER — Other Ambulatory Visit: Payer: Self-pay | Admitting: Family Medicine

## 2015-04-24 ENCOUNTER — Ambulatory Visit: Payer: 59 | Admitting: Physician Assistant

## 2015-05-06 ENCOUNTER — Ambulatory Visit (INDEPENDENT_AMBULATORY_CARE_PROVIDER_SITE_OTHER): Payer: 59 | Admitting: Physician Assistant

## 2015-05-06 ENCOUNTER — Encounter: Payer: Self-pay | Admitting: Physician Assistant

## 2015-05-06 VITALS — BP 127/78 | HR 90 | Ht 60.0 in | Wt 165.0 lb

## 2015-05-06 DIAGNOSIS — R634 Abnormal weight loss: Secondary | ICD-10-CM

## 2015-05-06 DIAGNOSIS — K5900 Constipation, unspecified: Secondary | ICD-10-CM | POA: Insufficient documentation

## 2015-05-06 DIAGNOSIS — E669 Obesity, unspecified: Secondary | ICD-10-CM | POA: Diagnosis not present

## 2015-05-06 MED ORDER — PHENTERMINE HCL 37.5 MG PO TABS: 37.5000 mg | ORAL_TABLET | Freq: Every day | ORAL | Status: DC

## 2015-05-06 NOTE — Progress Notes (Signed)
   Subjective:    Patient ID: Kristen Bright, female    DOB: 11/10/1965, 49 y.o.   MRN: [id number]  HPI  Pt presents to clinic to restart phentermine. She has been off since may. She has meant to come in for office visit but been too busy. She admits she is not exercising. She is eating healthier. She has not gained any weight since may. She did get constipated on phentermine last time refilled.     Review of Systems  All other systems reviewed and are negative.      Objective:   Physical Exam  Constitutional: She is oriented to person, place, and time. She appears well-developed and well-nourished.  HENT:  Head: Normocephalic and atraumatic.  Cardiovascular: Normal rate, regular rhythm and normal heart sounds.   Pulmonary/Chest: Effort normal and breath sounds normal.  Neurological: She is alert and oriented to person, place, and time.  Skin: Skin is dry.  Psychiatric: She has a normal mood and affect. Her behavior is normal.          Assessment & Plan:  Obesity/abnormal weight gain/constipation- BMI 32. Pt has remained stable since April 2016. Will restart phentermine. Discussed SE's. Follow up nurse visit in one month. Encouraged to add exercise and continue to maintain healthy diet under 1500 calories. Colace or miralax for constipation symptoms.

## 2015-09-14 ENCOUNTER — Ambulatory Visit (INDEPENDENT_AMBULATORY_CARE_PROVIDER_SITE_OTHER): Payer: 59 | Admitting: Physician Assistant

## 2015-09-14 ENCOUNTER — Encounter: Payer: Self-pay | Admitting: Physician Assistant

## 2015-09-14 VITALS — BP 147/86 | HR 107 | Ht 60.0 in | Wt 167.0 lb

## 2015-09-14 DIAGNOSIS — F411 Generalized anxiety disorder: Secondary | ICD-10-CM

## 2015-09-14 DIAGNOSIS — Z658 Other specified problems related to psychosocial circumstances: Secondary | ICD-10-CM | POA: Diagnosis not present

## 2015-09-14 DIAGNOSIS — I10 Essential (primary) hypertension: Secondary | ICD-10-CM | POA: Insufficient documentation

## 2015-09-14 DIAGNOSIS — F439 Reaction to severe stress, unspecified: Secondary | ICD-10-CM

## 2015-09-14 MED ORDER — LISINOPRIL 10 MG PO TABS: 10.0000 mg | ORAL_TABLET | Freq: Every day | ORAL | Status: DC

## 2015-09-14 MED ORDER — BUPROPION HCL ER (XL) 150 MG PO TB24: 150.0000 mg | ORAL_TABLET | Freq: Every day | ORAL | Status: DC

## 2015-09-14 MED FILL — LISINOPRIL 10 MG TABLET: 10 | 60 days supply | Qty: 60 | Fill #0

## 2015-09-14 MED FILL — BUPROPION HCL XL 150 MG TAB: 150 | 30 days supply | Qty: 30 | Fill #0

## 2015-09-14 NOTE — Progress Notes (Signed)
   Subjective:    Patient ID: Kristen Bright, female    DOB: December 12, 1965, 50 y.o.   MRN: [id number]  HPI  Pt is a 50 yo female who presents to the clinic with 2 weeks of BP elevation. She checks her BP regularly and stays around 125/80's. For the last 2 weeks running 140's over high 80's. She has noticed she has also felt more stressed. Both daughters moved out to go to college at [employer]. One daughter is having some roommate issues that is stressing her out. Overall pt feels stressed and uneasy. She started back on zoloft with little improvement. No CP, palpitations, headaches, dizziness. No suicidal or homicidal thoughts.       Review of Systems  All other systems reviewed and are negative.      Objective:   Physical Exam  Constitutional: She is oriented to person, place, and time. She appears well-developed and well-nourished.  HENT:  Head: Normocephalic and atraumatic.  Cardiovascular: Normal rate, regular rhythm and normal heart sounds.   Pulmonary/Chest: Effort normal and breath sounds normal.  Neurological: She is alert and oriented to person, place, and time.  Psychiatric: She has a normal mood and affect. Her behavior is normal.          Assessment & Plan:  HTN- started lisinopril to add to HCTZ. Follow up with BP check in 2 weeks. Discussed low sodium diet.   Anxiety/stress- possible that stress is increasing BP. Started wellbutrin daily. Discussed side effects. Follow up in 4-6 weeks. Discussed exercise and meditation to decrease stress. Per pt she added her zoloft back in as well for last month.   Obese/abnormal weight gain- will discussed at later date. Gave some options for pt to look into. Pt aware wellbutrin could help.

## 2015-09-14 NOTE — Patient Instructions (Signed)
Saxenda/belviq/qysmia

## 2015-10-07 ENCOUNTER — Ambulatory Visit: Payer: 59

## 2015-10-09 ENCOUNTER — Ambulatory Visit (INDEPENDENT_AMBULATORY_CARE_PROVIDER_SITE_OTHER): Payer: 59 | Admitting: Family Medicine

## 2015-10-09 VITALS — BP 125/77 | HR 86 | Wt 172.0 lb

## 2015-10-09 DIAGNOSIS — I10 Essential (primary) hypertension: Secondary | ICD-10-CM

## 2015-10-09 NOTE — Progress Notes (Signed)
Kristen Bright was in office for blood pressure and weigh check. Kristen Bright stated that she has been gaining weight since she has started taking the Wellbutrin 150 mg. Kristen Bright expressed that she would like to see Luvenia Starch and wants her to became her PCP. please advise. Rhonda Cunningham,CMA

## 2015-10-09 NOTE — Progress Notes (Signed)
   Subjective:    Patient ID: Kristen Bright, female    DOB: Oct 15, 1965, 50 y.o.   MRN: [id number]  HPI    Review of Systems     Objective:   Physical Exam        Assessment & Plan:  [place] with me.  Will send to [facility].  She can address wellbutrin issues.     Here today for blood pressure recheck. Was elevated when she came in on January 16. Her blood pressure was 147/86. Lisinopril was added to her hydrochlorothiazide. Blood pressure is at goal today and looks fantastic. Continue current regimen.  Hypertension- Pt denies chest pain, SOB, dizziness, or heart palpitations.  Taking meds as directed w/o problems.  Denies medication side effects.   Beatrice Lecher, MD

## 2015-10-12 ENCOUNTER — Telehealth: Payer: Self-pay | Admitting: Physician Assistant

## 2015-10-12 NOTE — Telephone Encounter (Signed)
Jade discussed with pt in office this morning.

## 2015-10-12 NOTE — Telephone Encounter (Signed)
Call pt: wellbutrin is in a weight loss medication so should not be causing you to gain weight if not lose should be weight neutral. How does your anxiety and stress feel? If you don't think that has improved we can certainly think about a taper. Come in to discuss.

## 2015-10-12 NOTE — Progress Notes (Signed)
I made a telephone note about this patient. Please see that note.

## 2015-10-28 ENCOUNTER — Other Ambulatory Visit: Payer: Self-pay | Admitting: Family Medicine

## 2015-10-28 DIAGNOSIS — Z9289 Personal history of other medical treatment: Secondary | ICD-10-CM

## 2015-11-04 ENCOUNTER — Ambulatory Visit (INDEPENDENT_AMBULATORY_CARE_PROVIDER_SITE_OTHER): Payer: 59

## 2015-11-04 ENCOUNTER — Ambulatory Visit: Payer: 59

## 2015-11-04 DIAGNOSIS — Z9289 Personal history of other medical treatment: Secondary | ICD-10-CM

## 2015-11-04 DIAGNOSIS — Z1231 Encounter for screening mammogram for malignant neoplasm of breast: Secondary | ICD-10-CM | POA: Diagnosis not present

## 2015-11-10 ENCOUNTER — Other Ambulatory Visit: Payer: Self-pay | Admitting: *Deleted

## 2015-11-10 ENCOUNTER — Other Ambulatory Visit: Payer: Self-pay | Admitting: Family Medicine

## 2015-11-10 ENCOUNTER — Other Ambulatory Visit: Payer: Self-pay | Admitting: Physician Assistant

## 2015-11-10 MED ORDER — HYDROCHLOROTHIAZIDE 25 MG PO TABS: ORAL_TABLET | ORAL | Status: DC

## 2015-11-10 MED ORDER — LISINOPRIL 10 MG PO TABS: 10.0000 mg | ORAL_TABLET | Freq: Every day | ORAL | Status: DC

## 2015-11-10 MED FILL — HYDROCHLOROTHIAZIDE 25 MG T: 25 | 90 days supply | Qty: 90 | Fill #0

## 2015-11-10 MED FILL — LISINOPRIL 10 MG TABLET: 10 | 90 days supply | Qty: 90 | Fill #0

## 2015-11-11 ENCOUNTER — Telehealth: Payer: Self-pay | Admitting: *Deleted

## 2015-11-11 NOTE — Telephone Encounter (Signed)
im ok with it. But you do have to understand that you can't stay on it forever. It is not a long term weight loss drug. If finding yourself gaining weight after going off need to track habits so that calories are staying in same range. [place] for 1 month with nurse visit.  Phentermine 37.5mg  1 tablet daily. #30 NRF

## 2015-11-11 NOTE — Telephone Encounter (Signed)
Pt called today and wanted you to know that since she is on the high deductible plan, the saxenda will cost her over $1000.  She wanted to know if you felt it was okay to do phentermine again.  Please advise.

## 2015-11-12 ENCOUNTER — Other Ambulatory Visit: Payer: Self-pay | Admitting: *Deleted

## 2015-11-12 MED ORDER — PHENTERMINE HCL 37.5 MG PO TABS: 37.5000 mg | ORAL_TABLET | Freq: Every day | ORAL | Status: DC

## 2015-11-12 MED FILL — PHENTERMINE 37.5 MG TABLET: 37.5 | 30 days supply | Qty: 30 | Fill #0

## 2015-11-12 NOTE — Telephone Encounter (Signed)
Rx sent to pharm.  Pt notified.

## 2015-12-07 MED FILL — PANTOPRAZOLE SOD DR 40 MG T: 40 | 90 days supply | Qty: 90 | Fill #1

## 2015-12-15 DIAGNOSIS — L812 Freckles: Secondary | ICD-10-CM | POA: Diagnosis not present

## 2015-12-15 DIAGNOSIS — L570 Actinic keratosis: Secondary | ICD-10-CM | POA: Diagnosis not present

## 2015-12-15 DIAGNOSIS — L821 Other seborrheic keratosis: Secondary | ICD-10-CM | POA: Diagnosis not present

## 2015-12-15 DIAGNOSIS — L728 Other follicular cysts of the skin and subcutaneous tissue: Secondary | ICD-10-CM | POA: Diagnosis not present

## 2015-12-15 DIAGNOSIS — D1801 Hemangioma of skin and subcutaneous tissue: Secondary | ICD-10-CM | POA: Diagnosis not present

## 2015-12-15 DIAGNOSIS — L739 Follicular disorder, unspecified: Secondary | ICD-10-CM | POA: Diagnosis not present

## 2015-12-15 DIAGNOSIS — L718 Other rosacea: Secondary | ICD-10-CM | POA: Diagnosis not present

## 2015-12-15 DIAGNOSIS — D225 Melanocytic nevi of trunk: Secondary | ICD-10-CM | POA: Diagnosis not present

## 2016-02-09 MED FILL — HYDROCHLOROTHIAZIDE 25 MG T: 25 | 90 days supply | Qty: 90 | Fill #1

## 2016-02-09 MED FILL — LISINOPRIL 10 MG TABLET: 10 | 90 days supply | Qty: 90 | Fill #1

## 2016-03-04 ENCOUNTER — Other Ambulatory Visit: Payer: Self-pay | Admitting: Family Medicine

## 2016-03-04 MED FILL — PANTOPRAZOLE SOD DR 40 MG T: 40 | 30 days supply | Qty: 30 | Fill #0

## 2016-03-08 DIAGNOSIS — H35363 Drusen (degenerative) of macula, bilateral: Secondary | ICD-10-CM | POA: Diagnosis not present

## 2016-03-08 DIAGNOSIS — H524 Presbyopia: Secondary | ICD-10-CM | POA: Diagnosis not present

## 2016-05-13 ENCOUNTER — Other Ambulatory Visit: Payer: Self-pay | Admitting: Physician Assistant

## 2016-05-13 ENCOUNTER — Other Ambulatory Visit: Payer: Self-pay | Admitting: Family Medicine

## 2016-05-13 MED FILL — LISINOPRIL 10 MG TABLET: 10 | 30 days supply | Qty: 30 | Fill #0

## 2016-05-13 MED FILL — PANTOPRAZOLE SOD DR 40 MG T: 40 | 30 days supply | Qty: 30 | Fill #0

## 2016-05-13 MED FILL — HYDROCHLOROTHIAZIDE 25 MG T: 25 | 30 days supply | Qty: 30 | Fill #0

## 2016-05-16 ENCOUNTER — Other Ambulatory Visit: Payer: Self-pay | Admitting: *Deleted

## 2016-05-16 MED ORDER — PANTOPRAZOLE SODIUM 40 MG PO TBEC: 40.0000 mg | DELAYED_RELEASE_TABLET | Freq: Every day | ORAL | 11 refills | Status: DC

## 2016-05-16 MED ORDER — LISINOPRIL 10 MG PO TABS: 10.0000 mg | ORAL_TABLET | Freq: Every day | ORAL | 1 refills | Status: DC

## 2016-05-16 MED ORDER — HYDROCHLOROTHIAZIDE 25 MG PO TABS
25.0000 mg | ORAL_TABLET | Freq: Every day | ORAL | 1 refills | Status: DC
Start: 2016-05-16 — End: 2016-06-30

## 2016-06-03 ENCOUNTER — Other Ambulatory Visit: Payer: Self-pay | Admitting: Physician Assistant

## 2016-06-03 MED ORDER — SERTRALINE HCL 50 MG PO TABS: 50.0000 mg | ORAL_TABLET | Freq: Every day | ORAL | 0 refills | Status: DC

## 2016-06-03 MED FILL — SERTRALINE HCL 50 MG TABLET: 50 | 30 days supply | Qty: 30 | Fill #0

## 2016-06-03 NOTE — Progress Notes (Signed)
I ran into patient in the hallway and her grandmother is sick. She wants to go back on zoloft. She is having a lot of depression and anxiety surrounding the situation. It has worked well for her in the past. She has a CPE with Dr. Madilyn Fireman in a few weeks. Will refill until CPE.

## 2016-06-14 MED FILL — LISINOPRIL 10 MG TABLET: 10 | 30 days supply | Qty: 30 | Fill #0

## 2016-06-14 MED FILL — HYDROCHLOROTHIAZIDE 25 MG T: 25 | 30 days supply | Qty: 30 | Fill #0

## 2016-06-14 MED FILL — PANTOPRAZOLE SOD DR 40 MG T: 40 | 30 days supply | Qty: 30 | Fill #0

## 2016-06-21 ENCOUNTER — Encounter: Payer: 59 | Admitting: Family Medicine

## 2016-06-29 ENCOUNTER — Other Ambulatory Visit: Payer: Self-pay | Admitting: Family Medicine

## 2016-06-29 DIAGNOSIS — Z0000 Encounter for general adult medical examination without abnormal findings: Secondary | ICD-10-CM

## 2016-06-30 ENCOUNTER — Encounter: Payer: Self-pay | Admitting: Family Medicine

## 2016-06-30 ENCOUNTER — Ambulatory Visit (INDEPENDENT_AMBULATORY_CARE_PROVIDER_SITE_OTHER): Payer: 59 | Admitting: Family Medicine

## 2016-06-30 VITALS — BP 133/69 | HR 87 | Ht 60.0 in | Wt 174.0 lb

## 2016-06-30 DIAGNOSIS — Z0000 Encounter for general adult medical examination without abnormal findings: Secondary | ICD-10-CM | POA: Diagnosis not present

## 2016-06-30 DIAGNOSIS — R7301 Impaired fasting glucose: Secondary | ICD-10-CM

## 2016-06-30 DIAGNOSIS — I10 Essential (primary) hypertension: Secondary | ICD-10-CM | POA: Diagnosis not present

## 2016-06-30 LAB — COMPLETE METABOLIC PANEL WITH GFR
ALT: 24 U/L (ref 6–29)
AST: 17 U/L (ref 10–35)
Albumin: 4.3 g/dL (ref 3.6–5.1)
Alkaline Phosphatase: 53 U/L (ref 33–115)
BILIRUBIN TOTAL: 0.5 mg/dL (ref 0.2–1.2)
BUN: 13 mg/dL (ref 7–25)
CO2: 24 mmol/L (ref 20–31)
Calcium: 9.5 mg/dL (ref 8.6–10.2)
Chloride: 103 mmol/L (ref 98–110)
Creat: 0.74 mg/dL (ref 0.50–1.10)
Glucose, Bld: 112 mg/dL — ABNORMAL HIGH (ref 65–99)
Potassium: 3.5 mmol/L (ref 3.5–5.3)
Sodium: 139 mmol/L (ref 135–146)
TOTAL PROTEIN: 7.1 g/dL (ref 6.1–8.1)

## 2016-06-30 LAB — FERRITIN: Ferritin: 94 ng/mL (ref 10–232)

## 2016-06-30 LAB — LIPID PANEL
Cholesterol: 229 mg/dL — ABNORMAL HIGH (ref 125–200)
HDL: 49 mg/dL (ref >=46)
LDL CALC: 156 mg/dL — AB (ref <130)
TRIGLYCERIDES: 118 mg/dL (ref <150)
Total CHOL/HDL Ratio: 4.7 Ratio (ref <=5.0)
VLDL: 24 mg/dL (ref <30)

## 2016-06-30 LAB — TSH: TSH: 1.79 mIU/L

## 2016-06-30 LAB — POCT GLYCOSYLATED HEMOGLOBIN (HGB A1C): Hemoglobin A1C: 6.2

## 2016-06-30 MED ORDER — SERTRALINE HCL 50 MG PO TABS: 75.0000 mg | ORAL_TABLET | Freq: Every day | ORAL | 1 refills | Status: DC

## 2016-06-30 MED ORDER — LISINOPRIL 10 MG PO TABS: 10.0000 mg | ORAL_TABLET | Freq: Every day | ORAL | 1 refills | Status: DC

## 2016-06-30 MED ORDER — METFORMIN HCL 500 MG PO TABS: 500.0000 mg | ORAL_TABLET | Freq: Every day | ORAL | 1 refills | Status: DC

## 2016-06-30 MED ORDER — HYDROCHLOROTHIAZIDE 25 MG PO TABS: 25.0000 mg | ORAL_TABLET | Freq: Every day | ORAL | 1 refills | Status: DC

## 2016-06-30 MED FILL — metFORMIN HCL 500 MG TABS: 500 | 90 days supply | Qty: 90 | Fill #0

## 2016-06-30 MED FILL — SERTRALINE HCL 50 MG TABLET: 50 | 90 days supply | Qty: 135 | Fill #0

## 2016-06-30 NOTE — Patient Instructions (Signed)
Keep up a regular exercise program and make sure you are eating a healthy diet Try to eat 4 servings of dairy a day, or if you are lactose intolerant take a calcium with vitamin D daily.  Your vaccines are up to date.   

## 2016-06-30 NOTE — Progress Notes (Signed)
Subjective:     Kristen Bright is a 50 y.o. female and is here for a comprehensive physical exam. The patient reports no problems. 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 She did have an ablation done about 2 years ago and has been very happy with the results. She walks 10,000-12,000 steps per day. Her goal weight is around 159 pounds and she recently joined Weight Watchers to try to get back on track. She got down to that weight last year.  Social History   Social History  . Marital status: Married    Spouse name: Ronalee Belts  . Number of children: N/A  . Years of education: N/A   Occupational History  .  [employer].    [place]   Social History Main Topics  . Smoking status: Former Smoker    Types: Cigarettes  . Smokeless tobacco: Not on file     Comment: Says smoked socially for about 3 years when younger  . Alcohol use Yes     Comment: rarely  . Drug use:   . Sexual activity: Yes    Partners: Male   Other Topics Concern  . Not on file   Social History Narrative   Some exercise.     Health Maintenance  Topic Date Due  . HIV Screening  02/03/1982  . PAP SMEAR  05/06/2017  . TETANUS/TDAP  08/30/2019  . INFLUENZA VACCINE  Completed    The following portions of the patient's history were reviewed and updated as appropriate: allergies, current medications, past family history, past medical history, past social history, past surgical history and problem list.  Review of Systems A comprehensive review of systems was negative.   Objective:    BP 133/69   Pulse 87   Ht 5' (1.524 m)   Wt 174 lb (78.9 kg)   SpO2 100%   BMI 33.98 kg/m  General appearance: alert, cooperative and appears stated age Head: Normocephalic, without obvious abnormality, atraumatic Eyes: conj clear, EOMI, pEERLA Ears: normal TM's and external ear canals both ears Nose: Nares normal. Septum midline. Mucosa normal. No drainage or sinus tenderness. Throat: lips, mucosa, and  tongue normal; teeth and gums normal Neck: no adenopathy, no carotid bruit, no JVD, supple, symmetrical, trachea midline and thyroid not enlarged, symmetric, no tenderness/mass/nodules Back: symmetric, no curvature. ROM normal. No CVA tenderness. Lungs: clear to auscultation bilaterally Heart: regular rate and rhythm, S1, S2 normal, no murmur, click, rub or gallop Abdomen: soft, non-tender; bowel sounds normal; no masses,  no organomegaly Extremities: extremities normal, atraumatic, no cyanosis or edema Pulses: 2+ and symmetric Skin: Skin color, texture, turgor normal. No rashes or lesions Lymph nodes: Cervical adenopathy: nl and Axillary adenopathy: nl Neurologic: Alert and oriented X 3, normal strength and tone. Normal symmetric reflexes. Normal coordination and gait    Assessment:    Healthy female exam.     Plan:     See After Visit Summary for Counseling Recommendations   Keep up a regular exercise program and make sure you are eating a healthy diet Try to eat 4 servings of dairy a day, or if you are lactose intolerant take a calcium with vitamin D daily.  Your vaccines are up to date.   HTN - Well controlled. Continue current regimen. Follow up in  6 months.    Anxiety/overeating - she is doing well on the Zoloft and feels like it helps just curb her anxiety levels which helps curb her  appetite. She would like to try going up a little bit but doesn't want to go up to 100 mg. Will increase to 75 mg. Follow-up in 6 months. Continue with Weight Watchers.  IFG - A1C is up to 6.2.  Discussed options of metformin.

## 2016-07-13 MED FILL — HYDROCHLOROTHIAZIDE 25 MG T: 25 | 30 days supply | Qty: 30 | Fill #1

## 2016-07-13 MED FILL — LISINOPRIL 10 MG TABLET: 10 | 30 days supply | Qty: 30 | Fill #1

## 2016-08-18 ENCOUNTER — Encounter: Payer: Self-pay | Admitting: Family Medicine

## 2016-08-18 ENCOUNTER — Ambulatory Visit (INDEPENDENT_AMBULATORY_CARE_PROVIDER_SITE_OTHER): Payer: 59 | Admitting: Family Medicine

## 2016-08-18 DIAGNOSIS — S161XXA Strain of muscle, fascia and tendon at neck level, initial encounter: Secondary | ICD-10-CM | POA: Diagnosis not present

## 2016-08-18 DIAGNOSIS — M5030 Other cervical disc degeneration, unspecified cervical region: Secondary | ICD-10-CM | POA: Insufficient documentation

## 2016-08-18 MED ORDER — CYCLOBENZAPRINE HCL 5 MG PO TABS: 5.0000 mg | ORAL_TABLET | Freq: Two times a day (BID) | ORAL | 1 refills | Status: DC | PRN

## 2016-08-18 MED FILL — CYCLOBENZAPRINE 5 MG TABLET: 5 | 5 days supply | Qty: 30 | Fill #0
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
## 2016-08-18 NOTE — Patient Instructions (Signed)
Thank you for coming in today. Continue physical therapy. Take up to 2 Aleve twice daily for pain. Using a heating pad. Use Flexeril at bedtime as needed for muscle spasms. Return if not improving. Come back or go to the emergency room if you notice new weakness new numbness problems walking or bowel or bladder problems.   Cervical Strain and Sprain Rehab Ask your health care provider which exercises are safe for you. Do exercises exactly as told by your health care provider and adjust them as directed. It is normal to feel mild stretching, pulling, tightness, or discomfort as you do these exercises, but you should stop right away if you feel sudden pain or your pain gets worse.Do not begin these exercises until told by your health care provider. Stretching and range of motion exercises These exercises warm up your muscles and joints and improve the movement and flexibility of your neck. These exercises also help to relieve pain, numbness, and tingling. Exercise A: Cervical side bend 1. Using good posture, sit on a stable chair or stand up. 2. Without moving your shoulders, slowly tilt your left / right ear to your shoulder until you feel a stretch in your neck muscles. You should be looking straight ahead. 3. Hold for __________ seconds. 4. Repeat with the other side of your neck. Repeat __________ times. Complete this exercise __________ times a day. Exercise B: Cervical rotation 1. Using good posture, sit on a stable chair or stand up. 2. Slowly turn your head to the side as if you are looking over your left / right shoulder.  Keep your eyes level with the ground.  Stop when you feel a stretch along the side and the back of your neck. 3. Hold for __________ seconds. 4. Repeat this by turning to your other side. Repeat __________ times. Complete this exercise __________ times a day. Exercise C: Thoracic extension and pectoral stretch 1. Roll a towel or a small blanket so it is about  4 inches (10 cm) in diameter. 2. Lie down on your back on a firm surface. 3. Put the towel lengthwise, under your spine in the middle of your back. It should not be not under your shoulder blades. The towel should line up with your spine from your middle back to your lower back. 4. Put your hands behind your head and let your elbows fall out to your sides. 5. Hold for __________ seconds. Repeat __________ times. Complete this exercise __________ times a day. Strengthening exercises These exercises build strength and endurance in your neck. Endurance is the ability to use your muscles for a long time, even after your muscles get tired. Exercise D: Upper cervical flexion, isometric 1. Lie on your back with a thin pillow behind your head and a small rolled-up towel under your neck. 2. Gently tuck your chin toward your chest and nod your head down to look toward your feet. Do not lift your head off the pillow. 3. Hold for __________ seconds. 4. Release the tension slowly. Relax your neck muscles completely before you repeat this exercise. Repeat __________ times. Complete this exercise __________ times a day. Exercise E: Cervical extension, isometric 1. Stand about 6 inches (15 cm) away from a wall, with your back facing the wall. 2. Place a soft object, about 6-8 inches (15-20 cm) in diameter, between the back of your head and the wall. A soft object could be a small pillow, a ball, or a folded towel. 3. Gently tilt your head back  and press into the soft object. Keep your jaw and forehead relaxed. 4. Hold for __________ seconds. 5. Release the tension slowly. Relax your neck muscles completely before you repeat this exercise. Repeat __________ times. Complete this exercise __________ times a day. Posture and body mechanics   Body mechanics refers to the movements and positions of your body while you do your daily activities. Posture is part of body mechanics. Good posture and healthy body  mechanics can help to relieve stress in your body's tissues and joints. Good posture means that your spine is in its natural S-curve position (your spine is neutral), your shoulders are pulled back slightly, and your head is not tipped forward. The following are general guidelines for applying improved posture and body mechanics to your everyday activities. Standing  When standing, keep your spine neutral and keep your feet about hip-width apart. Keep a slight bend in your knees. Your ears, shoulders, and hips should line up.  When you do a task in which you stand in one place for a long time, place one foot up on a stable object that is 2-4 inches (5-10 cm) high, such as a footstool. This helps keep your spine neutral. Sitting  When sitting, keep your spine neutral and your keep feet flat on the floor. Use a footrest, if necessary, and keep your thighs parallel to the floor. Avoid rounding your shoulders, and avoid tilting your head forward.  When working at a desk or a computer, keep your desk at a height where your hands are slightly lower than your elbows. Slide your chair under your desk so you are close enough to maintain good posture.  When working at a computer, place your monitor at a height where you are looking straight ahead and you do not have to tilt your head forward or downward to look at the screen. Resting When lying down and resting, avoid positions that are most painful for you. Try to support your neck in a neutral position. You can use a contour pillow or a small rolled-up towel. Your pillow should support your neck but not push on it. This information is not intended to replace advice given to you by your health care provider. Make sure you discuss any questions you have with your health care provider. Document Released: 08/15/2005 Document Revised: 04/21/2016 Document Reviewed: 07/22/2015 Elsevier Interactive Patient Education  2017 [employer].

## 2016-08-19 MED FILL — LISINOPRIL 10 MG TABLET: 10 | 90 days supply | Qty: 90 | Fill #0

## 2016-08-19 MED FILL — HYDROCHLOROTHIAZIDE 25 MG T: 25 | 90 days supply | Qty: 90 | Fill #0

## 2016-09-09 ENCOUNTER — Other Ambulatory Visit: Payer: Self-pay | Admitting: *Deleted

## 2016-09-09 MED ORDER — BENZONATATE 200 MG PO CAPS: 200.0000 mg | ORAL_CAPSULE | Freq: Three times a day (TID) | ORAL | 0 refills | Status: DC | PRN

## 2016-09-09 MED FILL — BENZONATATE 200 MG CAPSULE: 200 | 10 days supply | Qty: 30 | Fill #0

## 2016-09-20 DIAGNOSIS — M9902 Segmental and somatic dysfunction of thoracic region: Secondary | ICD-10-CM | POA: Diagnosis not present

## 2016-09-20 DIAGNOSIS — M542 Cervicalgia: Secondary | ICD-10-CM | POA: Diagnosis not present

## 2016-09-20 DIAGNOSIS — M791 Myalgia: Secondary | ICD-10-CM | POA: Diagnosis not present

## 2016-09-20 DIAGNOSIS — M9901 Segmental and somatic dysfunction of cervical region: Secondary | ICD-10-CM | POA: Diagnosis not present

## 2016-09-20 DIAGNOSIS — M546 Pain in thoracic spine: Secondary | ICD-10-CM | POA: Diagnosis not present

## 2016-09-27 DIAGNOSIS — M546 Pain in thoracic spine: Secondary | ICD-10-CM | POA: Diagnosis not present

## 2016-09-27 DIAGNOSIS — M791 Myalgia: Secondary | ICD-10-CM | POA: Diagnosis not present

## 2016-09-27 DIAGNOSIS — M9902 Segmental and somatic dysfunction of thoracic region: Secondary | ICD-10-CM | POA: Diagnosis not present

## 2016-09-27 DIAGNOSIS — M9901 Segmental and somatic dysfunction of cervical region: Secondary | ICD-10-CM | POA: Diagnosis not present

## 2016-09-27 DIAGNOSIS — M542 Cervicalgia: Secondary | ICD-10-CM | POA: Diagnosis not present

## 2016-10-05 MED FILL — metFORMIN HCL 500 MG TABS: 500 | 90 days supply | Qty: 90 | Fill #1

## 2016-10-25 DIAGNOSIS — M542 Cervicalgia: Secondary | ICD-10-CM | POA: Diagnosis not present

## 2016-10-25 DIAGNOSIS — M546 Pain in thoracic spine: Secondary | ICD-10-CM | POA: Diagnosis not present

## 2016-10-25 DIAGNOSIS — M791 Myalgia: Secondary | ICD-10-CM | POA: Diagnosis not present

## 2016-10-25 DIAGNOSIS — M9901 Segmental and somatic dysfunction of cervical region: Secondary | ICD-10-CM | POA: Diagnosis not present

## 2016-10-25 DIAGNOSIS — M9902 Segmental and somatic dysfunction of thoracic region: Secondary | ICD-10-CM | POA: Diagnosis not present

## 2016-11-01 DIAGNOSIS — M542 Cervicalgia: Secondary | ICD-10-CM | POA: Diagnosis not present

## 2016-11-01 DIAGNOSIS — M9902 Segmental and somatic dysfunction of thoracic region: Secondary | ICD-10-CM | POA: Diagnosis not present

## 2016-11-01 DIAGNOSIS — M791 Myalgia: Secondary | ICD-10-CM | POA: Diagnosis not present

## 2016-11-01 DIAGNOSIS — M9901 Segmental and somatic dysfunction of cervical region: Secondary | ICD-10-CM | POA: Diagnosis not present

## 2016-11-01 DIAGNOSIS — M546 Pain in thoracic spine: Secondary | ICD-10-CM | POA: Diagnosis not present

## 2016-11-15 ENCOUNTER — Ambulatory Visit (INDEPENDENT_AMBULATORY_CARE_PROVIDER_SITE_OTHER): Payer: 59 | Admitting: Physician Assistant

## 2016-11-15 VITALS — BP 130/76 | HR 78

## 2016-11-15 DIAGNOSIS — R202 Paresthesia of skin: Secondary | ICD-10-CM | POA: Diagnosis not present

## 2016-11-15 DIAGNOSIS — R200 Anesthesia of skin: Secondary | ICD-10-CM | POA: Diagnosis not present

## 2016-11-15 MED ORDER — VALACYCLOVIR HCL 1 G PO TABS: 1000.0000 mg | ORAL_TABLET | Freq: Three times a day (TID) | ORAL | 0 refills | Status: DC

## 2016-11-15 MED FILL — valACYclovir HCL 1 GM TABS: 1 | 7 days supply | Qty: 21 | Fill #0

## 2016-11-15 NOTE — Progress Notes (Signed)
   Subjective:    Patient ID: Kristen Bright, female    DOB: 1966-07-14, 51 y.o.   MRN: [id number]  HPI Pt is a 51 yo female who presents to the clinic with numbness and tingling of left lower back for 5 days. She only describes sensation on left lower back. It almost feels burning and sandpaper like when her clothes touch it. No rash, fever, chills, body aches. She has not tried anything to make better. Not effected by ROM or deep palpitation.    Review of Systems  All other systems reviewed and are negative.      Objective:   Physical Exam  Constitutional: She is oriented to person, place, and time. She appears well-developed and well-nourished.  HENT:  Head: Normocephalic and atraumatic.  Cardiovascular: Normal rate, regular rhythm and normal heart sounds.   Musculoskeletal:  Full ROM of waist and left hip.  No tenderness over lumbar spine to palpation.  No tenderness over lumbar muscles to deep palpation.   Neurological: She is alert and oriented to person, place, and time.  Skin:  No rash seen.   Psychiatric: She has a normal mood and affect. Her behavior is normal.          Assessment & Plan:  Marland KitchenMarland KitchenDiagnoses and all orders for this visit:  Numbness and tingling sensation of skin -     valACYclovir (VALTREX) 1000 MG tablet; Take 1 tablet (1,000 mg total) by mouth 3 (three) times daily. For 7 days.   Unclear etiology. Does not appear to be musculoskeletal. No rash but could be shingles presentation before rash. Sent antiviral and discussed what to look for. Follow up as needed or if new symptoms occur.

## 2016-11-15 NOTE — Patient Instructions (Signed)
Shingles Shingles is an infection that causes a painful skin rash and fluid-filled blisters. Shingles is caused by the same virus that causes chickenpox. Shingles only develops in people who:  Have had chickenpox.  Have gotten the chickenpox vaccine. (This is rare.) The first symptoms of shingles may be itching, tingling, or pain in an area on your skin. A rash will follow in a few days or weeks. The rash is usually on one side of the body in a bandlike or beltlike pattern. Over time, the rash turns into fluid-filled blisters that break open, scab over, and dry up. Medicines may:  Help you manage pain.  Help you recover more quickly.  Help to prevent long-term problems. Follow these instructions at home: Medicines  Take medicines only as told by your doctor.  Apply an anti-itch or numbing cream to the affected area as told by your doctor. Blister and Rash Care  Take a cool bath or put cool compresses on the area of the rash or blisters as told by your doctor. This may help with pain and itching.  Keep your rash covered with a loose bandage (dressing). Wear loose-fitting clothing.  Keep your rash and blisters clean with mild soap and cool water or as told by your doctor.  Check your rash every day for signs of infection. These include redness, swelling, and pain that lasts or gets worse.  Do not pick your blisters.  Do not scratch your rash. General instructions  Rest as told by your doctor.  Keep all follow-up visits as told by your doctor. This is important.  Until your blisters scab over, your infection can cause chickenpox in people who have never had it or been vaccinated against it. To prevent this from happening, avoid touching other people or being around other people, especially:  Babies.  Pregnant women.  Children who have eczema.  Elderly people who have transplants.  People who have chronic illnesses, such as leukemia or AIDS. Contact a doctor if:  Your  pain does not get better with medicine.  Your pain does not get better after the rash heals.  Your rash looks infected. Signs of infection include:  Redness.  Swelling.  Pain that lasts or gets worse. Get help right away if:  The rash is on your face or nose.  You have pain in your face, pain around your eye area, or loss of feeling on one side of your face.  You have ear pain or you have ringing in your ear.  You have loss of taste.  Your condition gets worse. This information is not intended to replace advice given to you by your health care provider. Make sure you discuss any questions you have with your health care provider. Document Released: 02/01/2008 Document Revised: 04/10/2016 Document Reviewed: 05/27/2014 Elsevier Interactive Patient Education  2017 [employer] Inc[employer]  

## 2016-11-16 ENCOUNTER — Encounter: Payer: Self-pay | Admitting: Physician Assistant

## 2016-11-18 MED FILL — PANTOPRAZOLE SOD DR 40 MG T: 40 | 90 days supply | Qty: 90 | Fill #1

## 2016-11-22 ENCOUNTER — Other Ambulatory Visit: Payer: Self-pay | Admitting: *Deleted

## 2016-11-22 DIAGNOSIS — R200 Anesthesia of skin: Secondary | ICD-10-CM

## 2016-11-22 DIAGNOSIS — R202 Paresthesia of skin: Principal | ICD-10-CM

## 2016-11-22 MED ORDER — VALACYCLOVIR HCL 1 G PO TABS: 1000.0000 mg | ORAL_TABLET | Freq: Three times a day (TID) | ORAL | 0 refills | Status: DC

## 2016-11-22 MED FILL — valACYclovir HCL 1 GM TABS: 1 | 7 days supply | Qty: 21 | Fill #0

## 2016-11-29 DIAGNOSIS — M546 Pain in thoracic spine: Secondary | ICD-10-CM | POA: Diagnosis not present

## 2016-11-29 DIAGNOSIS — M542 Cervicalgia: Secondary | ICD-10-CM | POA: Diagnosis not present

## 2016-11-29 DIAGNOSIS — M9902 Segmental and somatic dysfunction of thoracic region: Secondary | ICD-10-CM | POA: Diagnosis not present

## 2016-11-29 DIAGNOSIS — M791 Myalgia: Secondary | ICD-10-CM | POA: Diagnosis not present

## 2016-11-29 DIAGNOSIS — M9901 Segmental and somatic dysfunction of cervical region: Secondary | ICD-10-CM | POA: Diagnosis not present

## 2016-12-13 MED FILL — LISINOPRIL 10 MG TABLET: 10 | 90 days supply | Qty: 90 | Fill #1

## 2016-12-13 MED FILL — HYDROCHLOROTHIAZIDE 25 MG T: 25 | 90 days supply | Qty: 90 | Fill #1

## 2017-01-17 ENCOUNTER — Encounter: Payer: Self-pay | Admitting: Physician Assistant

## 2017-01-17 ENCOUNTER — Ambulatory Visit (INDEPENDENT_AMBULATORY_CARE_PROVIDER_SITE_OTHER): Payer: 59 | Admitting: Physician Assistant

## 2017-01-17 VITALS — BP 134/77 | HR 81 | Ht 60.0 in | Wt 170.0 lb

## 2017-01-17 DIAGNOSIS — R635 Abnormal weight gain: Secondary | ICD-10-CM

## 2017-01-17 DIAGNOSIS — I10 Essential (primary) hypertension: Secondary | ICD-10-CM | POA: Diagnosis not present

## 2017-01-17 DIAGNOSIS — E7800 Pure hypercholesterolemia, unspecified: Secondary | ICD-10-CM

## 2017-01-17 DIAGNOSIS — R7301 Impaired fasting glucose: Secondary | ICD-10-CM

## 2017-01-17 LAB — POCT GLYCOSYLATED HEMOGLOBIN (HGB A1C): Hemoglobin A1C: 6

## 2017-01-17 MED ORDER — METFORMIN HCL 500 MG PO TABS: 500.0000 mg | ORAL_TABLET | Freq: Every day | ORAL | 1 refills | Status: DC

## 2017-01-17 MED ORDER — LISINOPRIL 10 MG PO TABS: 10.0000 mg | ORAL_TABLET | Freq: Every day | ORAL | 1 refills | Status: DC

## 2017-01-17 MED ORDER — PHENTERMINE HCL 37.5 MG PO TABS: 37.5000 mg | ORAL_TABLET | Freq: Every day | ORAL | 0 refills | Status: DC

## 2017-01-17 MED ORDER — CYCLOBENZAPRINE HCL 5 MG PO TABS: 5.0000 mg | ORAL_TABLET | Freq: Two times a day (BID) | ORAL | 1 refills | Status: DC | PRN

## 2017-01-17 MED ORDER — HYDROCHLOROTHIAZIDE 25 MG PO TABS: 25.0000 mg | ORAL_TABLET | Freq: Every day | ORAL | 1 refills | Status: DC

## 2017-01-17 MED FILL — SERTRALINE HCL 50 MG TABLET: 50 | 90 days supply | Qty: 135 | Fill #1

## 2017-01-17 MED FILL — metFORMIN HCL 500 MG TABS: 500 | 90 days supply | Qty: 90 | Fill #0

## 2017-01-17 MED FILL — PHENTERMINE 37.5 MG TABLET: 37.5 | 30 days supply | Qty: 30 | Fill #0

## 2017-01-17 MED FILL — CYCLOBENZAPRINE 5 MG TABLET: 5 | 5 days supply | Qty: 30 | Fill #0

## 2017-01-17 NOTE — Patient Instructions (Signed)
Dr. Leafy Ro [facility].

## 2017-01-17 NOTE — Progress Notes (Signed)
   Subjective:    Patient ID: Kristen Bright, female    DOB: 15-Nov-1965, 51 y.o.   MRN: [id number]  HPI Pt is a 51 yo female who presents to the clinic for follow up and medication refill.   She has IFG and wants to keep an eye on her sugars. She is not checking them. She has noticed she has gained some weight. She would like to go back on phentermine for a few months. She is not exercising. She is trying to limit calories.   HtN- no CP, palpitations, headaches, SOB, or vision changes.   She has elevated cholesterol but not on medications. Would like rechecked.    Review of Systems  All other systems reviewed and are negative.      Objective:   Physical Exam  Constitutional: She is oriented to person, place, and time. She appears well-developed and well-nourished.  Obese.   HENT:  Head: Normocephalic and atraumatic.  Cardiovascular: Normal rate, regular rhythm and normal heart sounds.   Pulmonary/Chest: Effort normal and breath sounds normal. She has no wheezes.  Neurological: She is alert and oriented to person, place, and time.  Psychiatric: She has a normal mood and affect. Her behavior is normal.          Assessment & Plan:  Marland KitchenMarland KitchenDiagnoses and all orders for this visit:  Essential hypertension, benign -     hydrochlorothiazide (HYDRODIURIL) 25 MG tablet; Take 1 tablet (25 mg total) by mouth daily. -     lisinopril (PRINIVIL,ZESTRIL) 10 MG tablet; Take 1 tablet (10 mg total) by mouth daily. -     COMPLETE METABOLIC PANEL WITH GFR  IFG (impaired fasting glucose) -     POCT HgB A1C -     metFORMIN (GLUCOPHAGE) 500 MG tablet; Take 1 tablet (500 mg total) by mouth daily with breakfast. -     COMPLETE METABOLIC PANEL WITH GFR  Abnormal weight gain -     phentermine (ADIPEX-P) 37.5 MG tablet; Take 1 tablet (37.5 mg total) by mouth daily before breakfast.  Pure hypercholesterolemia -     Lipid panel  Other orders -     cyclobenzaprine (FLEXERIL) 5 MG tablet; Take 1-2  tablets (5-10 mg total) by mouth 3 times/day as needed-between meals & bedtime for muscle spasms.     .. Lab Results  Component Value Date   HGBA1C 6.0 01/17/2017  A!C better but still in pre-diabetes.   Phentermine discussed. Offered saxenda/belviq. Pt opted for phentermine due to cost and high deductible plan. Discussed 1500 calorie diet and 150 minutes of exercise weekly. Discussed looking into Dr. Leafy [facility] obesity clinic. I would gladly make referral.

## 2017-01-18 ENCOUNTER — Encounter: Payer: Self-pay | Admitting: Physician Assistant

## 2017-01-18 DIAGNOSIS — E782 Mixed hyperlipidemia: Secondary | ICD-10-CM | POA: Insufficient documentation

## 2017-01-24 ENCOUNTER — Ambulatory Visit: Payer: 59 | Admitting: Physician Assistant

## 2017-02-08 DIAGNOSIS — R7301 Impaired fasting glucose: Secondary | ICD-10-CM | POA: Diagnosis not present

## 2017-02-08 DIAGNOSIS — I10 Essential (primary) hypertension: Secondary | ICD-10-CM | POA: Diagnosis not present

## 2017-02-08 DIAGNOSIS — E7800 Pure hypercholesterolemia, unspecified: Secondary | ICD-10-CM | POA: Diagnosis not present

## 2017-02-08 LAB — COMPLETE METABOLIC PANEL WITH GFR
ALT: 14 U/L (ref 6–29)
AST: 11 U/L (ref 10–35)
Albumin: 4.2 g/dL (ref 3.6–5.1)
Alkaline Phosphatase: 53 U/L (ref 33–130)
BUN: 11 mg/dL (ref 7–25)
CHLORIDE: 100 mmol/L (ref 98–110)
CO2: 27 mmol/L (ref 20–31)
Calcium: 9.3 mg/dL (ref 8.6–10.4)
Creat: 0.67 mg/dL (ref 0.50–1.05)
GFR, Est African American: >89 mL/min (ref >=60)
GFR, Est Non African American: >89 mL/min (ref >=60)
GLUCOSE: 121 mg/dL — AB (ref 65–99)
POTASSIUM: 3.6 mmol/L (ref 3.5–5.3)
SODIUM: 138 mmol/L (ref 135–146)
Total Bilirubin: 0.7 mg/dL (ref 0.2–1.2)
Total Protein: 6.9 g/dL (ref 6.1–8.1)

## 2017-02-08 LAB — LIPID PANEL
CHOLESTEROL: 203 mg/dL — AB (ref <200)
HDL: 53 mg/dL (ref >50)
LDL CALC: 124 mg/dL — AB (ref <100)
TRIGLYCERIDES: 131 mg/dL (ref <150)
Total CHOL/HDL Ratio: 3.8 Ratio (ref <5.0)
VLDL: 26 mg/dL (ref <30)

## 2017-02-17 MED FILL — PANTOPRAZOLE SOD DR 40 MG T: 40 | 90 days supply | Qty: 90 | Fill #2

## 2017-02-22 ENCOUNTER — Other Ambulatory Visit: Payer: Self-pay | Admitting: Family Medicine

## 2017-02-22 DIAGNOSIS — Z1231 Encounter for screening mammogram for malignant neoplasm of breast: Secondary | ICD-10-CM

## 2017-02-28 ENCOUNTER — Ambulatory Visit (INDEPENDENT_AMBULATORY_CARE_PROVIDER_SITE_OTHER): Payer: 59

## 2017-02-28 DIAGNOSIS — Z1231 Encounter for screening mammogram for malignant neoplasm of breast: Secondary | ICD-10-CM

## 2017-03-02 ENCOUNTER — Encounter (INDEPENDENT_AMBULATORY_CARE_PROVIDER_SITE_OTHER): Payer: Self-pay

## 2017-03-14 DIAGNOSIS — H35363 Drusen (degenerative) of macula, bilateral: Secondary | ICD-10-CM | POA: Diagnosis not present

## 2017-03-14 DIAGNOSIS — H524 Presbyopia: Secondary | ICD-10-CM | POA: Diagnosis not present

## 2017-03-14 DIAGNOSIS — H5203 Hypermetropia, bilateral: Secondary | ICD-10-CM | POA: Diagnosis not present

## 2017-03-30 MED FILL — HYDROCHLOROTHIAZIDE 25 MG T: 25 | 90 days supply | Qty: 90 | Fill #0

## 2017-03-30 MED FILL — LISINOPRIL 10 MG TABLET: 10 | 90 days supply | Qty: 90 | Fill #0

## 2017-04-18 MED FILL — metFORMIN HCL 500 MG TABS: 500 | 90 days supply | Qty: 90 | Fill #1

## 2017-05-19 ENCOUNTER — Ambulatory Visit (INDEPENDENT_AMBULATORY_CARE_PROVIDER_SITE_OTHER): Payer: 59 | Admitting: Physician Assistant

## 2017-05-19 ENCOUNTER — Encounter: Payer: Self-pay | Admitting: Physician Assistant

## 2017-05-19 VITALS — BP 118/72 | HR 85 | Wt 174.0 lb

## 2017-05-19 DIAGNOSIS — R5381 Other malaise: Secondary | ICD-10-CM | POA: Diagnosis not present

## 2017-05-19 DIAGNOSIS — R42 Dizziness and giddiness: Secondary | ICD-10-CM | POA: Diagnosis not present

## 2017-05-19 DIAGNOSIS — I952 Hypotension due to drugs: Secondary | ICD-10-CM | POA: Diagnosis not present

## 2017-05-19 NOTE — Patient Instructions (Addendum)
Consider starting zyrtec/allegra Stop lisinopril. Recheck BP in 2 weeks.  Benign Positional Vertigo Vertigo is the feeling that you or your surroundings are moving when they are not. Benign positional vertigo is the most common form of vertigo. The cause of this condition is not serious (is benign). This condition is triggered by certain movements and positions (is positional). This condition can be dangerous if it occurs while you are doing something that could endanger you or others, such as driving. What are the causes? In many cases, the cause of this condition is not known. It may be caused by a disturbance in an area of the inner ear that helps your brain to sense movement and balance. This disturbance can be caused by a viral infection (labyrinthitis), head injury, or repetitive motion. What increases the risk? This condition is more likely to develop in:  Women.  People who are 50 years of age or older.  What are the signs or symptoms? Symptoms of this condition usually happen when you move your head or your eyes in different directions. Symptoms may start suddenly, and they usually last for less than a minute. Symptoms may include:  Loss of balance and falling.  Feeling like you are spinning or moving.  Feeling like your surroundings are spinning or moving.  Nausea and vomiting.  Blurred vision.  Dizziness.  Involuntary eye movement (nystagmus).  Symptoms can be mild and cause only slight annoyance, or they can be severe and interfere with daily life. Episodes of benign positional vertigo may return (recur) over time, and they may be triggered by certain movements. Symptoms may improve over time. How is this diagnosed? This condition is usually diagnosed by medical history and a physical exam of the head, neck, and ears. You may be referred to a health care provider who specializes in ear, nose, and throat (ENT) problems (otolaryngologist) or a provider who specializes in  disorders of the nervous system (neurologist). You may have additional testing, including:  MRI.  A CT scan.  Eye movement tests. Your health care provider may ask you to change positions quickly while he or she watches you for symptoms of benign positional vertigo, such as nystagmus. Eye movement may be tested with an electronystagmogram (ENG), caloric stimulation, the Dix-Hallpike test, or the roll test.  An electroencephalogram (EEG). This records electrical activity in your brain.  Hearing tests.  How is this treated? Usually, your health care provider will treat this by moving your head in specific positions to adjust your inner ear back to normal. Surgery may be needed in severe cases, but this is rare. In some cases, benign positional vertigo may resolve on its own in 2-4 weeks. Follow these instructions at home: Safety  Move slowly.Avoid sudden body or head movements.  Avoid driving.  Avoid operating heavy machinery.  Avoid doing any tasks that would be dangerous to you or others if a vertigo episode would occur.  If you have trouble walking or keeping your balance, try using a cane for stability. If you feel dizzy or unstable, sit down right away.  Return to your normal activities as told by your health care provider. Ask your health care provider what activities are safe for you. General instructions  Take over-the-counter and prescription medicines only as told by your health care provider.  Avoid certain positions or movements as told by your health care provider.  Drink enough fluid to keep your urine clear or pale yellow.  Keep all follow-up visits as told by  your health care provider. This is important. Contact a health care provider if:  You have a fever.  Your condition gets worse or you develop new symptoms.  Your family or friends notice any behavioral changes.  Your nausea or vomiting gets worse.  You have numbness or a "pins and needles"  sensation. Get help right away if:  You have difficulty speaking or moving.  You are always dizzy.  You faint.  You develop severe headaches.  You have weakness in your legs or arms.  You have changes in your hearing or vision.  You develop a stiff neck.  You develop sensitivity to light. This information is not intended to replace advice given to you by your health care provider. Make sure you discuss any questions you have with your health care provider. Document Released: 05/23/2006 Document Revised: 01/21/2016 Document Reviewed: 12/08/2014 Elsevier Interactive Patient Education  [employer].

## 2017-05-19 NOTE — Progress Notes (Signed)
Subjective:    Patient ID: Kristen Bright, female    DOB: Sep 21, 1965, 51 y.o.   MRN: [id number]  Dizziness    Pt is a 51 yo female who presents to the clinic for dizziness/ "swimmy headed" for last few weeks. BP today looks great but has reported lower BP's. She denies any nausea, cough, SOB or wheezing. She has had a ST yesterday, fullness in ears. Not started any new medications. She has had some off and on dull headaches. She is most concerned with fatigue she is having. She has no energy for last 2 weeks. At times she just wants to close her eyes during the day and sleep.   .. Active Ambulatory Problems    Diagnosis Date Noted  . ANXIETY 02/28/2010  . 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 ELEVATED BLOOD PRESSURE WITHOUT DIAGNOSIS OF HYPERTENSION 11/09/2010  . IFG (impaired fasting glucose) 05/28/2013  . OSA (obstructive sleep apnea) 09/30/2013  . Anemia, iron deficiency 10/02/2013  . Low iron stores 10/02/2013  . Heart murmur 06/24/2014  . Fatty liver disease, nonalcoholic 09/32/6712  . Obesity 11/04/2014  . BMI 34.0-34.9,adult 11/04/2014  . Abnormal weight gain 11/04/2014  . CN (constipation) 05/06/2015  . Stress 09/14/2015  . Anxiety state 09/14/2015  . Essential hypertension, benign 09/14/2015  . Cervical strain, acute, initial encounter 08/18/2016  . Pure hypercholesterolemia 01/18/2017   Resolved Ambulatory Problems    Diagnosis Date Noted  . Abnormal weight loss 05/06/2015   Past Medical History:  Diagnosis Date  . Anxiety      Review of Systems  Neurological: Positive for dizziness.       Objective:   Physical Exam  Constitutional: She appears well-developed and well-nourished.  HENT:  Head: Normocephalic and atraumatic.  Right Ear: Tympanic membrane, external ear and ear canal normal.  Left Ear: Tympanic membrane, external ear and ear canal normal.  Nose: Nose normal. Right sinus exhibits no maxillary sinus tenderness and no frontal sinus tenderness. Left  sinus exhibits no maxillary sinus tenderness and no frontal sinus tenderness.  Mouth/Throat: Oropharynx is clear and moist.  No maxillary or frontal tenderness to palpation.  Nares open and patent.   Neck: Neck supple.  Cardiovascular: Normal rate, regular rhythm and normal heart sounds.   Pulmonary/Chest: Breath sounds normal.   Dix hallpike - dizziness to the right, no nystagmus either way       Assessment & Plan:  Marland KitchenMarland KitchenEdgar was seen today for fatigue and dizziness.  Diagnoses and all orders for this visit:  Dizziness -     CBC -     Comprehensive metabolic panel -     C-reactive protein -     Ferritin -     Sedimentation rate -     TSH -     Vitamin B12 -     Vit D  25 hydroxy (rtn osteoporosis monitoring)  Malaise -     CBC -     Comprehensive metabolic panel -     C-reactive protein -     Ferritin -     Sedimentation rate -     TSH -     Vitamin B12 -     Vit D  25 hydroxy (rtn osteoporosis monitoring)  Hypotension due to drugs   Will get some labs to evaluate. Appears like BPPV. epley manuevers given to start 3 times a day in rounds of 3. Discussed antivert as needed. Start flonase regularly.   Orthostatic were negative.   BP low  today and pt has checked and been low. Stop lisinopril. Continue on HcTZ. Follow up in 4 weeks or sooner if needed.

## 2017-05-20 LAB — CBC
HEMATOCRIT: 39.2 % (ref 35.0–45.0)
Hemoglobin: 13.5 g/dL (ref 11.7–15.5)
MCH: 29.5 pg (ref 27.0–33.0)
MCHC: 34.4 g/dL (ref 32.0–36.0)
MCV: 85.6 fL (ref 80.0–100.0)
MPV: 10 fL (ref 7.5–12.5)
Platelets: 346 10*3/uL (ref 140–400)
RBC: 4.58 10*6/uL (ref 3.80–5.10)
RDW: 13.1 % (ref 11.0–15.0)
WBC: 7.8 10*3/uL (ref 3.8–10.8)

## 2017-05-20 LAB — COMPREHENSIVE METABOLIC PANEL
AG Ratio: 1.6 (calc) (ref 1.0–2.5)
ALBUMIN MSPROF: 4.3 g/dL (ref 3.6–5.1)
ALKALINE PHOSPHATASE (APISO): 54 U/L (ref 33–130)
ALT: 19 U/L (ref 6–29)
AST: 14 U/L (ref 10–35)
BUN: 16 mg/dL (ref 7–25)
CO2: 28 mmol/L (ref 20–32)
CREATININE: 0.77 mg/dL (ref 0.50–1.05)
Calcium: 9.2 mg/dL (ref 8.6–10.4)
Chloride: 99 mmol/L (ref 98–110)
Globulin: 2.7 g/dL (calc) (ref 1.9–3.7)
Glucose, Bld: 124 mg/dL — ABNORMAL HIGH (ref 65–99)
POTASSIUM: 3.6 mmol/L (ref 3.5–5.3)
Sodium: 137 mmol/L (ref 135–146)
TOTAL PROTEIN: 7 g/dL (ref 6.1–8.1)
Total Bilirubin: 0.3 mg/dL (ref 0.2–1.2)

## 2017-05-20 LAB — VITAMIN B12: VITAMIN B 12: 561 pg/mL (ref 200–1100)

## 2017-05-20 LAB — C-REACTIVE PROTEIN: CRP: 7.2 mg/L (ref <8.0)

## 2017-05-20 LAB — VITAMIN D 25 HYDROXY (VIT D DEFICIENCY, FRACTURES): Vit D, 25-Hydroxy: 33 ng/mL (ref 30–100)

## 2017-05-20 LAB — FERRITIN: Ferritin: 80 ng/mL (ref 10–232)

## 2017-05-20 LAB — SEDIMENTATION RATE: SED RATE: 6 mm/h (ref 0–20)

## 2017-05-20 LAB — TSH: TSH: 1.99 m[IU]/L

## 2017-05-21 ENCOUNTER — Encounter: Payer: Self-pay | Admitting: Physician Assistant

## 2017-05-22 ENCOUNTER — Telehealth: Payer: Self-pay | Admitting: Physician Assistant

## 2017-05-22 NOTE — Telephone Encounter (Signed)
Patient called to get lab results from 05/19/17. Pt request to know if her iron is low still not feeling well. Pt adv you can send her a message on my chart. Thanks

## 2017-05-22 NOTE — Telephone Encounter (Signed)
Not resulted at the moment.

## 2017-05-22 NOTE — Progress Notes (Signed)
Call pt: CBC great.  Kidney, liver, inflammation rate is low.  B12 great.  Vitamin D normal but did drop from last check. Make sure taking some vitamin d.  No overt cause of fatigue.

## 2017-05-22 NOTE — Telephone Encounter (Signed)
See result note. No found cause of fatigue. Labs overall look really good.

## 2017-07-14 ENCOUNTER — Other Ambulatory Visit: Payer: Self-pay | Admitting: Physician Assistant

## 2017-07-14 ENCOUNTER — Other Ambulatory Visit: Payer: Self-pay | Admitting: Family Medicine

## 2017-07-14 DIAGNOSIS — R7301 Impaired fasting glucose: Secondary | ICD-10-CM

## 2017-07-14 MED FILL — HYDROCHLOROTHIAZIDE 25 MG T: 25 | 90 days supply | Qty: 90 | Fill #1

## 2017-07-14 MED FILL — SERTRALINE HCL 50 MG TABLET: 50 | 90 days supply | Qty: 135 | Fill #0

## 2017-07-17 ENCOUNTER — Other Ambulatory Visit: Payer: Self-pay | Admitting: Physician Assistant

## 2017-07-17 DIAGNOSIS — I10 Essential (primary) hypertension: Secondary | ICD-10-CM

## 2017-07-17 DIAGNOSIS — R7301 Impaired fasting glucose: Secondary | ICD-10-CM

## 2017-07-17 MED ORDER — METFORMIN HCL 500 MG PO TABS: 500.0000 mg | ORAL_TABLET | Freq: Every day | ORAL | 1 refills | Status: DC

## 2017-07-17 MED ORDER — HYDROCHLOROTHIAZIDE 25 MG PO TABS: 25.0000 mg | ORAL_TABLET | Freq: Every day | ORAL | 1 refills | Status: DC

## 2017-07-17 MED FILL — metFORMIN HCL 500 MG TABS: 500 | 90 days supply | Qty: 90 | Fill #0

## 2017-08-01 ENCOUNTER — Other Ambulatory Visit: Payer: Self-pay | Admitting: Physician Assistant

## 2017-08-09 MED FILL — PANTOPRAZOLE SOD DR 40 MG T: 40 | 90 days supply | Qty: 90 | Fill #0

## 2017-09-05 MED FILL — CYCLOBENZAPRINE 5 MG TABLET: 5 | 5 days supply | Qty: 30 | Fill #1

## 2017-09-19 ENCOUNTER — Encounter: Payer: 59 | Admitting: Physician Assistant

## 2017-10-18 ENCOUNTER — Telehealth: Payer: Self-pay | Admitting: Physician Assistant

## 2017-10-18 DIAGNOSIS — K760 Fatty (change of) liver, not elsewhere classified: Secondary | ICD-10-CM

## 2017-10-18 DIAGNOSIS — I10 Essential (primary) hypertension: Secondary | ICD-10-CM

## 2017-10-18 DIAGNOSIS — E7800 Pure hypercholesterolemia, unspecified: Secondary | ICD-10-CM

## 2017-10-18 DIAGNOSIS — R7301 Impaired fasting glucose: Secondary | ICD-10-CM

## 2017-10-18 NOTE — Telephone Encounter (Signed)
Labs ordered. Left VM advising Pt.

## 2017-10-18 NOTE — Telephone Encounter (Signed)
PT has an upcoming CPE scheduled with Luvenia Starch and needs her Fasting lab orders so she can get this completed in the morning 10/19/17. Leave at [employer] and let her know.

## 2017-10-20 MED FILL — HYDROCHLOROTHIAZIDE 25 MG T: 25 | 90 days supply | Qty: 90 | Fill #0

## 2017-10-20 MED FILL — SERTRALINE HCL 50 MG TABLET: 50 | 90 days supply | Qty: 135 | Fill #1

## 2017-10-20 MED FILL — metFORMIN HCL 500 MG TABS: 500 | 90 days supply | Qty: 90 | Fill #1

## 2017-10-24 ENCOUNTER — Encounter: Payer: 59 | Admitting: Physician Assistant

## 2017-11-03 MED FILL — PANTOPRAZOLE SOD DR 40 MG T: 40 | 90 days supply | Qty: 90 | Fill #1

## 2017-11-13 DIAGNOSIS — E7800 Pure hypercholesterolemia, unspecified: Secondary | ICD-10-CM | POA: Diagnosis not present

## 2017-11-13 DIAGNOSIS — I10 Essential (primary) hypertension: Secondary | ICD-10-CM | POA: Diagnosis not present

## 2017-11-13 DIAGNOSIS — R7301 Impaired fasting glucose: Secondary | ICD-10-CM | POA: Diagnosis not present

## 2017-11-13 DIAGNOSIS — K760 Fatty (change of) liver, not elsewhere classified: Secondary | ICD-10-CM | POA: Diagnosis not present

## 2017-11-14 ENCOUNTER — Telehealth: Payer: Self-pay | Admitting: Physician Assistant

## 2017-11-14 ENCOUNTER — Encounter: Payer: Self-pay | Admitting: Physician Assistant

## 2017-11-14 ENCOUNTER — Ambulatory Visit (INDEPENDENT_AMBULATORY_CARE_PROVIDER_SITE_OTHER): Payer: 59 | Admitting: Physician Assistant

## 2017-11-14 VITALS — BP 137/91 | HR 105 | Ht 60.0 in | Wt 176.0 lb

## 2017-11-14 DIAGNOSIS — Z1211 Encounter for screening for malignant neoplasm of colon: Secondary | ICD-10-CM

## 2017-11-14 DIAGNOSIS — E782 Mixed hyperlipidemia: Secondary | ICD-10-CM | POA: Diagnosis not present

## 2017-11-14 DIAGNOSIS — Z0000 Encounter for general adult medical examination without abnormal findings: Secondary | ICD-10-CM

## 2017-11-14 DIAGNOSIS — R413 Other amnesia: Secondary | ICD-10-CM | POA: Diagnosis not present

## 2017-11-14 DIAGNOSIS — Z6834 Body mass index (BMI) 34.0-34.9, adult: Secondary | ICD-10-CM

## 2017-11-14 DIAGNOSIS — E6609 Other obesity due to excess calories: Secondary | ICD-10-CM

## 2017-11-14 DIAGNOSIS — I10 Essential (primary) hypertension: Secondary | ICD-10-CM | POA: Diagnosis not present

## 2017-11-14 DIAGNOSIS — R7301 Impaired fasting glucose: Secondary | ICD-10-CM | POA: Diagnosis not present

## 2017-11-14 LAB — COMPLETE METABOLIC PANEL WITH GFR
AG Ratio: 1.7 (calc) (ref 1.0–2.5)
ALBUMIN MSPROF: 4.5 g/dL (ref 3.6–5.1)
ALKALINE PHOSPHATASE (APISO): 57 U/L (ref 33–130)
ALT: 21 U/L (ref 6–29)
AST: 15 U/L (ref 10–35)
BUN: 12 mg/dL (ref 7–25)
CO2: 26 mmol/L (ref 20–32)
CREATININE: 0.68 mg/dL (ref 0.50–1.05)
Calcium: 9.5 mg/dL (ref 8.6–10.4)
Chloride: 103 mmol/L (ref 98–110)
GFR, Est African American: 118 mL/min/{1.73_m2} (ref >=60)
GFR, Est Non African American: 102 mL/min/{1.73_m2} (ref >=60)
GLUCOSE: 120 mg/dL — AB (ref 65–99)
Globulin: 2.7 g/dL (calc) (ref 1.9–3.7)
Potassium: 3.7 mmol/L (ref 3.5–5.3)
Sodium: 138 mmol/L (ref 135–146)
Total Bilirubin: 0.5 mg/dL (ref 0.2–1.2)
Total Protein: 7.2 g/dL (ref 6.1–8.1)

## 2017-11-14 LAB — CBC WITH DIFFERENTIAL/PLATELET
Basophils Absolute: 23 cells/uL (ref 0–200)
Basophils Relative: 0.3 %
EOS PCT: 0.8 %
Eosinophils Absolute: 62 cells/uL (ref 15–500)
HCT: 40.6 % (ref 35.0–45.0)
HEMOGLOBIN: 13.8 g/dL (ref 11.7–15.5)
Lymphs Abs: 2087 cells/uL (ref 850–3900)
MCH: 29 pg (ref 27.0–33.0)
MCHC: 34 g/dL (ref 32.0–36.0)
MCV: 85.3 fL (ref 80.0–100.0)
MONOS PCT: 6.6 %
MPV: 10.2 fL (ref 7.5–12.5)
NEUTROS ABS: 5020 {cells}/uL (ref 1500–7800)
Neutrophils Relative %: 65.2 %
Platelets: 322 10*3/uL (ref 140–400)
RBC: 4.76 10*6/uL (ref 3.80–5.10)
RDW: 12.9 % (ref 11.0–15.0)
Total Lymphocyte: 27.1 %
WBC mixed population: 508 cells/uL (ref 200–950)
WBC: 7.7 10*3/uL (ref 3.8–10.8)

## 2017-11-14 LAB — LIPID PANEL
CHOL/HDL RATIO: 4.5 (calc) (ref <5.0)
CHOLESTEROL: 226 mg/dL — AB (ref <200)
HDL: 50 mg/dL — ABNORMAL LOW (ref >50)
LDL CHOLESTEROL (CALC): 156 mg/dL — AB
Non-HDL Cholesterol (Calc): 176 mg/dL (calc) — ABNORMAL HIGH (ref <130)
TRIGLYCERIDES: 90 mg/dL (ref <150)

## 2017-11-14 LAB — HEMOGLOBIN A1C
HEMOGLOBIN A1C: 6.2 %{Hb} — AB (ref <5.7)
Mean Plasma Glucose: 131 (calc)
eAG (mmol/L): 7.3 (calc)

## 2017-11-14 MED ORDER — LORCASERIN HCL ER 20 MG PO TB24: 1.0000 | ORAL_TABLET | Freq: Every day | ORAL | 1 refills | Status: DC

## 2017-11-14 NOTE — Telephone Encounter (Signed)
Received fax from [employer] that  requires a PA. Information has been sent to the insurance company. Awaiting determination.

## 2017-11-14 NOTE — Progress Notes (Signed)
Subjective:     Kristen Bright is a 52 y.o. female and is here for a comprehensive physical exam. The patient reports see below.    Pt is concerned about hx of concussions. She had 4 total concussion under age of 89. One concussion was so bad she had to have facial reconstruction at 52 yo. She feels like her memory is changing and not as sharp as she once was. She has felt the decline over the last 5 years. Denies any headaches or vision changes. She tends to lose her words, forget things, feel very scattered.  She wonders if any imaging should be done to follow up on this.   Social History   Socioeconomic History  . Marital status: Married    Spouse name: Kristen Bright  . Number of children: Not on file  . Years of education: Not on file  . Highest education level: Not on file  Occupational History    Employer: [email or web address].    Comment: [place]  Social Needs  . Financial resource strain: Not on file  . Food insecurity:    Worry: Not on file    Inability: Not on file  . Transportation needs:    Medical: Not on file    Non-medical: Not on file  Tobacco Use  . Smoking status: Former Smoker    Types: Cigarettes  . Smokeless tobacco: Never Used  . Tobacco comment: Says smoked socially for about 3 years when younger  Substance and Sexual Activity  . Alcohol use: Yes    Comment: rarely  . Drug use: Yes  . Sexual activity: Yes    Partners: Male  Lifestyle  . Physical activity:    Days per week: Not on file    Minutes per session: Not on file  . Stress: Not on file  Relationships  . Social connections:    Talks on phone: Not on file    Gets together: Not on file    Attends religious service: Not on file    Active member of club or organization: Not on file    Attends meetings of clubs or organizations: Not on file    Relationship status: Not on file  . Intimate partner violence:    Fear of current or ex partner: Not on file    Emotionally abused: Not on file     Physically abused: Not on file    Forced sexual activity: Not on file  Other Topics Concern  . Not on file  Social History Narrative   Some exercise.     Health Maintenance  Topic Date Due  . COLONOSCOPY  02/03/2017  . PAP SMEAR  05/06/2017  . HIV Screening  11/20/2018 (Originally 02/03/1981)  . MAMMOGRAM  03/01/2019  . TETANUS/TDAP  08/30/2019  . INFLUENZA VACCINE  Completed    The following portions of the patient's history were reviewed and updated as appropriate: allergies, current medications, past family history, past medical history, past social history, past surgical history and problem list.  Review of Systems Pertinent items noted in HPI and remainder of comprehensive ROS otherwise negative.   Objective:    BP (!) 137/91   Pulse (!) 105   Ht 5' (1.524 m)   Wt 176 lb (79.8 kg)   BMI 34.37 kg/m  General appearance: alert, cooperative and appears stated age Head: Normocephalic, without obvious abnormality, atraumatic Eyes: conjunctivae/corneas clear. PERRL, EOM's intact. Fundi benign. Ears: normal TM's and external ear canals both ears Nose: Nares normal. Septum  midline. Mucosa normal. No drainage or sinus tenderness. Throat: lips, mucosa, and tongue normal; teeth and gums normal Neck: no adenopathy, no carotid bruit, no JVD, supple, symmetrical, trachea midline and thyroid not enlarged, symmetric, no tenderness/mass/nodules Back: symmetric, no curvature. ROM normal. No CVA tenderness. Lungs: clear to auscultation bilaterally Heart: regular rate and rhythm, S1, S2 normal, no murmur, click, rub or gallop Abdomen: soft, non-tender; bowel sounds normal; no masses,  no organomegaly Extremities: extremities normal, atraumatic, no cyanosis or edema Pulses: 2+ and symmetric Skin: Skin color, texture, turgor normal. No rashes or lesions Lymph nodes: Cervical, supraclavicular, and axillary nodes normal. Neurologic: Alert and oriented X 3, normal strength and tone. Normal  symmetric reflexes. Normal coordination and gait    Assessment:    Healthy female exam.      Plan:  Marland KitchenMarland KitchenTaren was seen today for annual exam.  Diagnoses and all orders for this visit:  Routine physical examination  Class 1 obesity due to excess calories without serious comorbidity with body mass index (BMI) of 34.0 to 34.9 in adult -     Lorcaserin HCl ER (BELVIQ XR) 20 MG TB24; Take 1 tablet by mouth daily.  Memory changes  Colon cancer screening -     Ambulatory referral to Gastroenterology  .Marland Kitchen Depression screen [facility] 2/9 11/14/2017 01/17/2017  Decreased Interest 0 0  Down, Depressed, Hopeless 0 0  PHQ - 2 Score 0 0  Altered sleeping 0 -  Tired, decreased energy 0 -  Change in appetite 0 -  Feeling bad or failure about yourself  0 -  Trouble concentrating 1 -  Moving slowly or fidgety/restless 0 -  Suicidal thoughts 0 -  PHQ-9 Score 1 -  Difficult doing work/chores Not difficult at all -    Fasting labs discussed.   A!C- prediabetes. Discussed diabetic diet and weight loss in general.  ..Discussed low carb diet with 1500 calories and 80g of protein.  Exercising at least 150 minutes a week.  My Fitness Pal could be a Microbiologist.  Started belviq. Discussed side effects. Phentermine tried but weight came back. Years of trying to lose weight.   Cholesterol calculator showed 10 year cardiac risk around 5.3 percent. No indication for statin although as sugars climbing so is risk. Pt opted not to start medication today. Recheck labs in 6 months.   Memory changes.  .. 6CIT Screen 11/14/2017  What Year? 0 points  What month? 0 points  What time? 0 points  Count back from 20 0 points  Months in reverse 0 points  Repeat phrase 0 points  Total Score 0    Reassured memory testing looks great. Consider crossword puzzles, de-stressing activities.   Will see what test likely MRI would be best to detect brain changes. Encouraged to get eye exam.   Colonoscopy ordered.   Mammogram up to date.  Vaccines up to date. Discussed shingrix. Pt will consider.    See After Visit Summary for Counseling Recommendations

## 2017-11-14 NOTE — Patient Instructions (Signed)

## 2017-11-19 DIAGNOSIS — R413 Other amnesia: Secondary | ICD-10-CM | POA: Insufficient documentation

## 2017-11-19 DIAGNOSIS — R4189 Other symptoms and signs involving cognitive functions and awareness: Secondary | ICD-10-CM | POA: Insufficient documentation

## 2017-11-19 NOTE — Telephone Encounter (Signed)
Can we call Kristen Bright downstairs and find out what type of MRI should be ordered for hx of concussions and new memory changes?

## 2017-11-20 NOTE — Telephone Encounter (Signed)
Ordered.  Thanks so much.

## 2017-11-20 NOTE — Telephone Encounter (Signed)
Spoke with radiology - the MRI brain without covers head trauma, memory loss, vision changes, headaches. Brain w w/o would be more for tumor eval, meningitis, etc.

## 2017-11-22 MED FILL — BELVIQ XR 20 MG TABLET: 20 | 30 days supply | Qty: 30 | Fill #0

## 2017-11-22 NOTE — Telephone Encounter (Signed)
Received fax from [place] that [place] was approved from 11/17/2017 until 02/16/2018.  Pharmacy notified and forms sent to scan.   Reference ID: [id number].

## 2018-01-11 ENCOUNTER — Other Ambulatory Visit: Payer: Self-pay | Admitting: Physician Assistant

## 2018-01-11 DIAGNOSIS — R7301 Impaired fasting glucose: Secondary | ICD-10-CM

## 2018-01-11 MED FILL — metFORMIN HCL 500 MG TABS: 500 | 90 days supply | Qty: 90 | Fill #0

## 2018-01-11 MED FILL — SERTRALINE HCL 50 MG TABLET: 50 | 90 days supply | Qty: 135 | Fill #0

## 2018-01-12 ENCOUNTER — Telehealth: Payer: 59 | Admitting: Family

## 2018-01-12 DIAGNOSIS — B9789 Other viral agents as the cause of diseases classified elsewhere: Secondary | ICD-10-CM | POA: Diagnosis not present

## 2018-01-12 DIAGNOSIS — J329 Chronic sinusitis, unspecified: Secondary | ICD-10-CM

## 2018-01-12 MED ORDER — FLUTICASONE PROPIONATE 50 MCG/ACT NA SUSP: 2.0000 | Freq: Every day | NASAL | 2 refills | Status: DC

## 2018-01-12 MED FILL — FLUTICASONE PROP 50 MCG SPR: 50 | 30 days supply | Qty: 16 | Fill #0

## 2018-01-12 NOTE — Progress Notes (Signed)
Thank you for the details you included in the comment boxes. Those details are very helpful in determining the best course of treatment for you and help [place] to provide the best care. Providers prescribe antibiotics to treat infections caused by bacteria. Antibiotics are very powerful in treating bacterial infections when they are used properly. To maintain their effectiveness, they should be used only when necessary. Overuse of antibiotics has resulted in the development of superbugs that are resistant to treatment!    After careful review of your answers, I would not recommend an antibiotic for your condition.  Antibiotics are not effective against viruses and therefore should not be used to treat them. Common examples of infections caused by viruses include colds and flu  We are sorry that you are not feeling well.  Here is how we plan to help!  Based on what you have shared with me it looks like you have sinusitis.  Sinusitis is inflammation and infection in the sinus cavities of the head.  Based on your presentation I believe you most likely have Acute Viral Sinusitis.This is an infection most likely caused by a virus. There is not specific treatment for viral sinusitis other than to help you with the symptoms until the infection runs its course.  You may use an oral decongestant such as Mucinex D or if you have glaucoma or high blood pressure use plain Mucinex. Saline nasal spray help and can safely be used as often as needed for congestion, I have prescribed: Fluticasone nasal spray two sprays in each nostril once a day  Some authorities believe that zinc sprays or the use of Echinacea may shorten the course of your symptoms.  Sinus infections are not as easily transmitted as other respiratory infection, however we still recommend that you avoid close contact with loved ones, especially the very young and elderly.  Remember to wash your hands thoroughly throughout the day as this is the number one way  to prevent the spread of infection!  Home Care:  Only take medications as instructed by your medical team.  Do not take these medications with alcohol.  A steam or ultrasonic humidifier can help congestion.  You can place a towel over your head and breathe in the steam from hot water coming from a faucet.  Avoid close contacts especially the very young and the elderly.  Cover your mouth when you cough or sneeze.  Always remember to wash your hands.  Get Help Right Away If:  You develop worsening fever or sinus pain.  You develop a severe head ache or visual changes.  Your symptoms persist after you have completed your treatment plan.  Make sure you  Understand these instructions.  Will watch your condition.  Will get help right away if you are not doing well or get worse.  Your e-visit answers were reviewed by a board certified advanced clinical practitioner to complete your personal care plan.  Depending on the condition, your plan could have included both over the counter or prescription medications.  If there is a problem please reply  once you have received a response from your provider.  Your safety is important to [place].  If you have drug allergies check your prescription carefully.    You can use MyChart to ask questions about today's visit, request a non-urgent call back, or ask for a work or school excuse for 24 hours related to this e-Visit. If it has been greater than 24 hours you will need to follow  up with your provider, or enter a new e-Visit to address those concerns.  You will get an e-mail in the next two days asking about your experience.  I hope that your e-visit has been valuable and will speed your recovery. Thank you for using e-visits.

## 2018-01-16 MED FILL — HYDROCHLOROTHIAZIDE 25 MG T: 25 | 90 days supply | Qty: 90 | Fill #1

## 2018-01-17 ENCOUNTER — Ambulatory Visit: Payer: 59 | Admitting: Physician Assistant

## 2018-01-22 ENCOUNTER — Ambulatory Visit (INDEPENDENT_AMBULATORY_CARE_PROVIDER_SITE_OTHER): Payer: Self-pay | Admitting: Adult Health

## 2018-01-22 VITALS — BP 134/88 | HR 77 | Temp 98.7°F | Resp 20 | Wt 176.4 lb

## 2018-01-22 DIAGNOSIS — J302 Other seasonal allergic rhinitis: Secondary | ICD-10-CM

## 2018-01-22 DIAGNOSIS — J019 Acute sinusitis, unspecified: Secondary | ICD-10-CM

## 2018-01-22 MED ORDER — AMOXICILLIN-POT CLAVULANATE 875-125 MG PO TABS: 1.0000 | ORAL_TABLET | Freq: Two times a day (BID) | ORAL | 0 refills | Status: DC

## 2018-01-22 NOTE — Patient Instructions (Signed)
Fluticasone nasal spray What is this medicine? FLUTICASONE (floo TIK a sone) is a corticosteroid. This medicine is used to treat the symptoms of allergies like sneezing, itchy red eyes, and itchy, runny, or stuffy nose. This medicine is also used to treat nasal polyps. This medicine may be used for other purposes; ask your health care provider or pharmacist if you have questions. COMMON BRAND NAME(S): Flonase, Flonase Allergy Relief, Flonase Sensimist, Veramyst, XHANCE What should I tell my health care provider before I take this medicine? They need to know if you have any of these conditions: -cataracts -glaucoma -infection, like tuberculosis, herpes, or fungal infection -recent surgery on nose or sinuses -taking a corticosteroid by mouth -an unusual or allergic reaction to fluticasone, steroids, other medicines, foods, dyes, or preservatives -pregnant or trying to get pregnant -breast-feeding How should I use this medicine? This medicine is for use in the nose. Follow the directions on your product or prescription label. This medicine works best if used at regular intervals. Do not use more often than directed. Make sure that you are using your nasal spray correctly. After 6 months of daily use for allergies, talk to your doctor or health care professional before using it for a longer time. Ask your doctor or health care professional if you have any questions. Talk to your pediatrician regarding the use of this medicine in children. Special care may be needed. Some products have been used for allergies in children as young as 2 years. After 2 months of daily use without a prescription in a child, talk to your pediatrician before using it for a longer time. Use of this medicine for nasal polyps is not approved in children. Overdosage: If you think you have taken too much of this medicine contact a poison control center or emergency room at once. NOTE: This medicine is only for you. Do not share  this medicine with others. What if I miss a dose? If you miss a dose, use it as soon as you remember. If it is almost time for your next dose, use only that dose and continue with your regular schedule. Do not use double or extra doses. What may interact with this medicine? -certain antibiotics like clarithromycin and telithromycin -certain medicines for fungal infections like ketoconazole, itraconazole, and voriconazole -conivaptan -nefazodone -some medicines for HIV -vaccines This list may not describe all possible interactions. Give your health care provider a list of all the medicines, herbs, non-prescription drugs, or dietary supplements you use. Also tell them if you smoke, drink alcohol, or use illegal drugs. Some items may interact with your medicine. What should I watch for while using this medicine? Visit your doctor or health care professional for regular checks on your progress. Some symptoms may improve within 12 hours after starting use. Check with your doctor or health care professional if there is no improvement in your symptoms after 3 weeks of use. This medicine may increase your risk of getting an infection. Tell your doctor or health care professional if you are around anyone with measles or chickenpox, or if you develop sores or blisters that do not heal properly. What side effects may I notice from receiving this medicine? Side effects that you should report to your doctor or health care professional as soon as possible: -allergic reactions like skin rash, itching or hives, swelling of the face, lips, or tongue -changes in vision -crusting or sores in the nose -nosebleed -signs and symptoms of infection like fever or chills; cough; sore  throat -white patches or sores in the mouth or nose Side effects that usually do not require medical attention (report to your doctor or health care professional if they continue or are bothersome): -burning or irritation inside the nose  or throat -cough -headache -unusual taste or smell This list may not describe all possible side effects. Call your doctor for medical advice about side effects. You may report side effects to FDA at 1-800-FDA-1088. Where should I keep my medicine? Keep out of the reach of children. Store at room temperature between 15 and 30 degrees C (59 and 86 degrees F). Avoid exposure to extreme heat, cold, or light. Throw away any unused medicine after the expiration date. NOTE: This sheet is a summary. It may not cover all possible information. If you have questions about this medicine, talk to your doctor, pharmacist, or health care provider.  2018 Elsevier/Gold Standard (2016-05-27 14:23:12) Sinusitis, Adult Sinusitis is soreness and inflammation of your sinuses. Sinuses are hollow spaces in the bones around your face. They are located:  Around your eyes.  In the middle of your forehead.  Behind your nose.  In your cheekbones.  Your sinuses and nasal passages are lined with a stringy fluid (mucus). Mucus normally drains out of your sinuses. When your nasal tissues get inflamed or swollen, the mucus can get trapped or blocked so air cannot flow through your sinuses. This lets bacteria, viruses, and funguses grow, and that leads to infection. Follow these instructions at home: Medicines  Take, use, or apply over-the-counter and prescription medicines only as told by your doctor. These may include nasal sprays.  If you were prescribed an antibiotic medicine, take it as told by your doctor. Do not stop taking the antibiotic even if you start to feel better. Hydrate and Humidify  Drink enough water to keep your pee (urine) clear or pale yellow.  Use a cool mist humidifier to keep the humidity level in your home above 50%.  Breathe in steam for 10-15 minutes, 3-4 times a day or as told by your doctor. You can do this in the bathroom while a hot shower is running.  Try not to spend time in cool  or dry air. Rest  Rest as much as possible.  Sleep with your head raised (elevated).  Make sure to get enough sleep each night. General instructions  Put a warm, moist washcloth on your face 3-4 times a day or as told by your doctor. This will help with discomfort.  Wash your hands often with soap and water. If there is no soap and water, use hand sanitizer.  Do not smoke. Avoid being around people who are smoking (secondhand smoke).  Keep all follow-up visits as told by your doctor. This is important. Contact a doctor if:  You have a fever.  Your symptoms get worse.  Your symptoms do not get better within 10 days. Get help right away if:  You have a very bad headache.  You cannot stop throwing up (vomiting).  You have pain or swelling around your face or eyes.  You have trouble seeing.  You feel confused.  Your neck is stiff.  You have trouble breathing. This information is not intended to replace advice given to you by your health care provider. Make sure you discuss any questions you have with your health care provider. Document Released: 02/01/2008 Document Revised: 04/10/2016 Document Reviewed: 06/10/2015 Elsevier Interactive Patient Education  2018 [employer]. Cetirizine tablets What is this medicine? CETIRIZINE (se  TI ra zeen) is an antihistamine. This medicine is used to treat or prevent symptoms of allergies. It is also used to help reduce itchy skin rash and hives. This medicine may be used for other purposes; ask your health care provider or pharmacist if you have questions. COMMON BRAND NAME(S): All Day Allergy, Zyrtec, Zyrtec Hives Relief What should I tell my health care provider before I take this medicine? They need to know if you have any of these conditions: -kidney disease -liver disease -an unusual or allergic reaction to cetirizine, hydroxyzine, other medicines, foods, dyes, or preservatives -pregnant or trying to get  pregnant -breast-feeding How should I use this medicine? Take this medicine by mouth with a glass of water. Follow the directions on the prescription label. You can take this medicine with food or on an empty stomach. Take your medicine at regular times. Do not take more often than directed. You may need to take this medicine for several days before your symptoms improve. Talk to your pediatrician regarding the use of this medicine in children. Special care may be needed. While this drug may be prescribed for children as young as 15 years of age for selected conditions, precautions do apply. Overdosage: If you think you have taken too much of this medicine contact a poison control center or emergency room at once. NOTE: This medicine is only for you. Do not share this medicine with others. What if I miss a dose? If you miss a dose, take it as soon as you can. If it is almost time for your next dose, take only that dose. Do not take double or extra doses. What may interact with this medicine? -alcohol -certain medicines for anxiety or sleep -narcotic medicines for pain -other medicines for colds or allergies This list may not describe all possible interactions. Give your health care provider a list of all the medicines, herbs, non-prescription drugs, or dietary supplements you use. Also tell them if you smoke, drink alcohol, or use illegal drugs. Some items may interact with your medicine. What should I watch for while using this medicine? Visit your doctor or health care professional for regular checks on your health. Tell your doctor if your symptoms do not improve. You may get drowsy or dizzy. Do not drive, use machinery, or do anything that needs mental alertness until you know how this medicine affects you. Do not stand or sit up quickly, especially if you are an older patient. This reduces the risk of dizzy or fainting spells. Your mouth may get dry. Chewing sugarless gum or sucking hard candy,  and drinking plenty of water may help. Contact your doctor if the problem does not go away or is severe. What side effects may I notice from receiving this medicine? Side effects that you should report to your doctor or health care professional as soon as possible: -allergic reactions like skin rash, itching or hives, swelling of the face, lips, or tongue -changes in vision or hearing -fast or irregular heartbeat -trouble passing urine or change in the amount of urine Side effects that usually do not require medical attention (report to your doctor or health care professional if they continue or are bothersome): -dizziness -dry mouth -irritability -sore throat -stomach pain -tiredness This list may not describe all possible side effects. Call your doctor for medical advice about side effects. You may report side effects to FDA at 1-800-FDA-1088. Where should I keep my medicine? Keep out of the reach of children. Store at room  temperature between 15 and 30 degrees C (59 and 86 degrees F). Throw away any unused medicine after the expiration date. NOTE: This sheet is a summary. It may not cover all possible information. If you have questions about this medicine, talk to your doctor, pharmacist, or health care provider.  2018 Elsevier/Gold Standard (2014-09-09 13:44:42) Amoxicillin; Clavulanic Acid tablets What is this medicine? AMOXICILLIN; CLAVULANIC ACID (a mox i SIL in; KLAV yoo lan ic AS id) is a penicillin antibiotic. It is used to treat certain kinds of bacterial infections. It will not work for colds, flu, or other viral infections. This medicine may be used for other purposes; ask your health care provider or pharmacist if you have questions. COMMON BRAND NAME(S): Augmentin What should I tell my health care provider before I take this medicine? They need to know if you have any of these conditions: -bowel disease, like colitis -kidney disease -liver disease -mononucleosis -an  unusual or allergic reaction to amoxicillin, penicillin, cephalosporin, other antibiotics, clavulanic acid, other medicines, foods, dyes, or preservatives -pregnant or trying to get pregnant -breast-feeding How should I use this medicine? Take this medicine by mouth with a full glass of water. Follow the directions on the prescription label. Take at the start of a meal. Do not crush or chew. If the tablet has a score line, you may cut it in half at the score line for easier swallowing. Take your medicine at regular intervals. Do not take your medicine more often than directed. Take all of your medicine as directed even if you think you are better. Do not skip doses or stop your medicine early. Talk to your pediatrician regarding the use of this medicine in children. Special care may be needed. Overdosage: If you think you have taken too much of this medicine contact a poison control center or emergency room at once. NOTE: This medicine is only for you. Do not share this medicine with others. What if I miss a dose? If you miss a dose, take it as soon as you can. If it is almost time for your next dose, take only that dose. Do not take double or extra doses. What may interact with this medicine? -allopurinol -anticoagulants -birth control pills -methotrexate -probenecid This list may not describe all possible interactions. Give your health care provider a list of all the medicines, herbs, non-prescription drugs, or dietary supplements you use. Also tell them if you smoke, drink alcohol, or use illegal drugs. Some items may interact with your medicine. What should I watch for while using this medicine? Tell your doctor or health care professional if your symptoms do not improve. Do not treat diarrhea with over the counter products. Contact your doctor if you have diarrhea that lasts more than 2 days or if it is severe and watery. If you have diabetes, you may get a false-positive result for sugar in  your urine. Check with your doctor or health care professional. Birth control pills may not work properly while you are taking this medicine. Talk to your doctor about using an extra method of birth control. What side effects may I notice from receiving this medicine? Side effects that you should report to your doctor or health care professional as soon as possible: -allergic reactions like skin rash, itching or hives, swelling of the face, lips, or tongue -breathing problems -dark urine -fever or chills, sore throat -redness, blistering, peeling or loosening of the skin, including inside the mouth -seizures -trouble passing urine or change in  the amount of urine -unusual bleeding, bruising -unusually weak or tired -white patches or sores in the mouth or throat Side effects that usually do not require medical attention (report to your doctor or health care professional if they continue or are bothersome): -diarrhea -dizziness -headache -nausea, vomiting -stomach upset -vaginal or anal irritation This list may not describe all possible side effects. Call your doctor for medical advice about side effects. You may report side effects to FDA at 1-800-FDA-1088. Where should I keep my medicine? Keep out of the reach of children. Store at room temperature below 25 degrees C (77 degrees F). Keep container tightly closed. Throw away any unused medicine after the expiration date. NOTE: This sheet is a summary. It may not cover all possible information. If you have questions about this medicine, talk to your doctor, pharmacist, or health care provider.  2018 Elsevier/Gold Standard (2007-11-08 12:04:30)

## 2018-01-22 NOTE — Progress Notes (Addendum)
Subjective:     Patient ID: Kristen Bright, female   DOB: Feb 14, 1966, 52 y.o.   MRN: [id number]  HPI Blood pressure 134/88, pulse 77, temperature 98.7 F (37.1 C), temperature source Oral, resp. rate 20, weight 176 lb 6.4 oz (80 kg), SpO2 98 %.  Allergies  Allergen Reactions  . Lisinopril     Swelling  Per patient history      Patient is a 52 year old female in no acute distress who comes to the clinic for sinus congestion, ear fullness, generalized teeth pain,  sinus pressure worse on left side per patient. Marland KitchenE- visit  01/12/18 and was diagnosed with viral sinusitis.  She was given Flonase- she says she didn't start it.    She is taking Sudafed at day and Benadryl at night for one week.  She took Zyrtec occasionally during this.   No LMP recorded.Denies any chance of pregnancy she has had ablation and husband with vasectomy. She does have light menstrual still - last was one week ago.    Review of Systems  Constitutional: Positive for fatigue (mild ). Negative for activity change, appetite change, chills, diaphoresis, fever and unexpected weight change.  HENT: Positive for congestion, ear pain (left ear ), postnasal drip, rhinorrhea, sinus pressure and sneezing. Negative for dental problem, drooling, ear discharge, facial swelling, hearing loss, mouth sores, nosebleeds, sinus pain, sore throat, tinnitus, trouble swallowing and voice change.   Respiratory: Negative.   Cardiovascular: Negative.   Gastrointestinal: Negative.   Genitourinary: Negative.   Musculoskeletal: Negative.   Skin: Negative.   Neurological: Negative.   Hematological: Negative.   Psychiatric/Behavioral: Negative.        Objective:   Physical Exam  Constitutional: She is oriented to person, place, and time. Vital signs are normal. She appears well-developed and well-nourished. She is active. No distress.  Patient is alert and oriented and responsive to questions Engages in eye contact with provider. Speaks  in full sentences without any pauses without any shortness of breath or distress.    Patient moves on and off of exam table and in room without difficulty. Gait is normal in hall and in room. Patient is oriented to person place time and situation. Patient answers questions appropriately and engages in conversation.   HENT:  Head: Normocephalic and atraumatic.  Right Ear: Hearing, tympanic membrane, external ear and ear canal normal. Tympanic membrane is not perforated.  Left Ear: Hearing and external ear normal. Tympanic membrane is erythematous. Tympanic membrane is not perforated. A middle ear effusion is present.  Nose: Mucosal edema and rhinorrhea present. Right sinus exhibits no maxillary sinus tenderness and no frontal sinus tenderness. Left sinus exhibits maxillary sinus tenderness and frontal sinus tenderness.  Mouth/Throat: Uvula is midline, oropharynx is clear and moist and mucous membranes are normal. No oropharyngeal exudate, posterior oropharyngeal edema, posterior oropharyngeal erythema or tonsillar abscesses. No tonsillar exudate.  Reports tenderness mild to moderate left frontal and maxillary.   Eyes: Pupils are equal, round, and reactive to light. Conjunctivae and EOM are normal.  Neck: Normal range of motion. Neck supple.  Cardiovascular: Normal rate, regular rhythm, normal heart sounds and intact distal pulses. Exam reveals no gallop and no friction rub.  No murmur heard. Pulmonary/Chest: Effort normal and breath sounds normal. No stridor. No respiratory distress. She has no wheezes. She has no rales. She exhibits no tenderness.  Abdominal: Soft. Bowel sounds are normal.  Musculoskeletal: Normal range of motion.  Neurological: She is alert and oriented to person,  place, and time.  Skin: Skin is warm, dry and intact. Capillary refill takes less than 2 seconds. No rash noted. She is not diaphoretic. No cyanosis. Nails show no clubbing.  Psychiatric: She has a normal mood and  affect. Her speech is normal and behavior is normal. Judgment and thought content normal. Cognition and memory are normal.       Assessment:     Acute non-recurrent sinusitis, unspecified location  Seasonal allergies      Plan:     Meds ordered this encounter  Medications  . amoxicillin-clavulanate (AUGMENTIN) 875-125 MG tablet    Sig: Take 1 tablet by mouth 2 (two) times daily.    Dispense:  20 tablet    Refill:  0     Recommend follow up with Alden Hipp, Jade L, PA-C Recommend starting Flonase and Zyrtec per package instructions daily.  Advised patient call the office or your primary care doctor for an appointment if no improvement within 72 hours or if any symptoms change or worsen at any time  Advised ER or urgent Care if after hours or on weekend. Call 911 for emergency symptoms at any time.Patinet verbalized understanding of all instructions given/reviewed and treatment plan and has no further questions or concerns at this time.    Patient verbalized understanding of all instructions given and denies any further questions at this time.

## 2018-01-24 ENCOUNTER — Telehealth: Payer: Self-pay

## 2018-02-05 MED FILL — PANTOPRAZOLE SOD DR 40 MG T: 40 | 90 days supply | Qty: 90 | Fill #2

## 2018-03-20 DIAGNOSIS — H524 Presbyopia: Secondary | ICD-10-CM | POA: Diagnosis not present

## 2018-04-12 ENCOUNTER — Other Ambulatory Visit: Payer: Self-pay | Admitting: Physician Assistant

## 2018-04-12 DIAGNOSIS — I10 Essential (primary) hypertension: Secondary | ICD-10-CM

## 2018-04-12 MED FILL — SERTRALINE HCL 50 MG TABLET: 50 | 90 days supply | Qty: 135 | Fill #1

## 2018-04-12 MED FILL — metFORMIN HCL 500 MG TABS: 500 | 90 days supply | Qty: 90 | Fill #1

## 2018-04-12 MED FILL — HYDROCHLOROTHIAZIDE 25 MG T: 25 | 90 days supply | Qty: 90 | Fill #0

## 2018-04-24 DIAGNOSIS — L570 Actinic keratosis: Secondary | ICD-10-CM | POA: Diagnosis not present

## 2018-04-24 DIAGNOSIS — L738 Other specified follicular disorders: Secondary | ICD-10-CM | POA: Diagnosis not present

## 2018-05-09 DIAGNOSIS — M9902 Segmental and somatic dysfunction of thoracic region: Secondary | ICD-10-CM | POA: Diagnosis not present

## 2018-05-09 DIAGNOSIS — M9903 Segmental and somatic dysfunction of lumbar region: Secondary | ICD-10-CM | POA: Diagnosis not present

## 2018-05-09 DIAGNOSIS — M7918 Myalgia, other site: Secondary | ICD-10-CM | POA: Diagnosis not present

## 2018-05-09 DIAGNOSIS — M9905 Segmental and somatic dysfunction of pelvic region: Secondary | ICD-10-CM | POA: Diagnosis not present

## 2018-05-09 DIAGNOSIS — M545 Low back pain: Secondary | ICD-10-CM | POA: Diagnosis not present

## 2018-05-09 DIAGNOSIS — M542 Cervicalgia: Secondary | ICD-10-CM | POA: Diagnosis not present

## 2018-05-09 DIAGNOSIS — M9901 Segmental and somatic dysfunction of cervical region: Secondary | ICD-10-CM | POA: Diagnosis not present

## 2018-05-09 DIAGNOSIS — M546 Pain in thoracic spine: Secondary | ICD-10-CM | POA: Diagnosis not present

## 2018-05-22 DIAGNOSIS — Z1211 Encounter for screening for malignant neoplasm of colon: Secondary | ICD-10-CM | POA: Diagnosis not present

## 2018-05-22 DIAGNOSIS — R143 Flatulence: Secondary | ICD-10-CM | POA: Diagnosis not present

## 2018-06-29 MED FILL — SUPREP BOWEL PREP KIT: 17.5-3.13-1 | 1 days supply | Qty: 354 | Fill #0

## 2018-07-02 DIAGNOSIS — K635 Polyp of colon: Secondary | ICD-10-CM | POA: Diagnosis not present

## 2018-07-02 DIAGNOSIS — Z1211 Encounter for screening for malignant neoplasm of colon: Secondary | ICD-10-CM | POA: Diagnosis not present

## 2018-07-13 MED FILL — PANTOPRAZOLE SOD DR 40 MG T: 40 | 90 days supply | Qty: 90 | Fill #3

## 2018-08-07 DIAGNOSIS — M9901 Segmental and somatic dysfunction of cervical region: Secondary | ICD-10-CM | POA: Diagnosis not present

## 2018-08-07 DIAGNOSIS — M542 Cervicalgia: Secondary | ICD-10-CM | POA: Diagnosis not present

## 2018-08-07 DIAGNOSIS — M7918 Myalgia, other site: Secondary | ICD-10-CM | POA: Diagnosis not present

## 2018-08-07 DIAGNOSIS — M546 Pain in thoracic spine: Secondary | ICD-10-CM | POA: Diagnosis not present

## 2018-08-07 DIAGNOSIS — M9903 Segmental and somatic dysfunction of lumbar region: Secondary | ICD-10-CM | POA: Diagnosis not present

## 2018-08-07 DIAGNOSIS — M545 Low back pain: Secondary | ICD-10-CM | POA: Diagnosis not present

## 2018-08-07 DIAGNOSIS — M9902 Segmental and somatic dysfunction of thoracic region: Secondary | ICD-10-CM | POA: Diagnosis not present

## 2018-08-07 DIAGNOSIS — M9905 Segmental and somatic dysfunction of pelvic region: Secondary | ICD-10-CM | POA: Diagnosis not present

## 2018-08-13 ENCOUNTER — Other Ambulatory Visit: Payer: Self-pay | Admitting: Physician Assistant

## 2018-08-13 DIAGNOSIS — R7301 Impaired fasting glucose: Secondary | ICD-10-CM

## 2018-08-13 MED FILL — HYDROCHLOROTHIAZIDE 25 MG T: 25 | 90 days supply | Qty: 90 | Fill #1

## 2018-08-14 ENCOUNTER — Other Ambulatory Visit: Payer: Self-pay | Admitting: Physician Assistant

## 2018-08-14 DIAGNOSIS — I10 Essential (primary) hypertension: Secondary | ICD-10-CM

## 2018-08-14 DIAGNOSIS — R7301 Impaired fasting glucose: Secondary | ICD-10-CM

## 2018-08-14 MED ORDER — METFORMIN HCL 500 MG PO TABS
500.0000 mg | ORAL_TABLET | Freq: Every day | ORAL | 1 refills | Status: DC
Start: 2018-08-14 — End: 2019-03-19

## 2018-08-14 MED ORDER — SERTRALINE HCL 50 MG PO TABS: ORAL_TABLET | ORAL | 1 refills | Status: DC

## 2018-08-14 MED ORDER — PANTOPRAZOLE SODIUM 40 MG PO TBEC: 40.0000 mg | DELAYED_RELEASE_TABLET | Freq: Every day | ORAL | 3 refills | Status: DC

## 2018-08-14 MED ORDER — HYDROCHLOROTHIAZIDE 25 MG PO TABS: 25.0000 mg | ORAL_TABLET | Freq: Every day | ORAL | 3 refills | Status: DC

## 2018-08-14 MED FILL — SERTRALINE HCL 50 MG TABLET: 50 | 90 days supply | Qty: 135 | Fill #0

## 2018-08-14 MED FILL — metFORMIN HCL 500 MG TABS: 500 | 90 days supply | Qty: 90 | Fill #0

## 2018-08-14 NOTE — Progress Notes (Signed)
Pt came by and needed refills. Will make follow up appt.

## 2018-11-13 MED FILL — metFORMIN HCL 500 MG TABS: 500 | 90 days supply | Qty: 90 | Fill #1

## 2018-11-13 MED FILL — PANTOPRAZOLE SOD DR 40 MG T: 40 | 90 days supply | Qty: 90 | Fill #0

## 2018-11-13 MED FILL — HYDROCHLOROTHIAZIDE 25 MG T: 25 | 90 days supply | Qty: 90 | Fill #0

## 2018-11-13 MED FILL — SERTRALINE HCL 50 MG TABLET: 50 | 90 days supply | Qty: 135 | Fill #1

## 2018-11-13 MED FILL — ALPRAZolam 0.5 MG TABS: 0.5 | 2 days supply | Qty: 4 | Fill #0

## 2019-03-18 ENCOUNTER — Telehealth: Payer: 59 | Admitting: Family

## 2019-03-18 DIAGNOSIS — M542 Cervicalgia: Secondary | ICD-10-CM | POA: Diagnosis not present

## 2019-03-18 MED ORDER — NAPROXEN 500 MG PO TABS: 500.0000 mg | ORAL_TABLET | Freq: Two times a day (BID) | ORAL | 0 refills | Status: DC

## 2019-03-18 MED ORDER — BACLOFEN 10 MG PO TABS: 10.0000 mg | ORAL_TABLET | Freq: Three times a day (TID) | ORAL | 0 refills | Status: DC

## 2019-03-18 NOTE — Progress Notes (Signed)
We are sorry that you are not feeling well.  Here is how we plan to help!  Based on what you have shared with me it looks like you mostly have acute back pain.  Acute back pain is defined as musculoskeletal pain that can resolve in 1-3 weeks with conservative treatment.  Approximately 5 minutes was spent documenting and reviewing patient's chart.   I have prescribed Naprosyn 500 mg twice a day non-steroid anti-inflammatory (NSAID) as well as Baclofen 10 mg every eight hours as needed which is a muscle relaxer  Some patients experience stomach irritation or in increased heartburn with anti-inflammatory drugs.  Please keep in mind that muscle relaxer's can cause fatigue and should not be taken while at work or driving.  Back pain is very common.  The pain often gets better over time.  The cause of back pain is usually not dangerous.  Most people can learn to manage their back pain on their own.   If your pain worsens or does improve you need to follow up with your PCP.   Home Care  Stay active.  Start with short walks on flat ground if you can.  Try to walk farther each day.  Do not sit, drive or stand in one place for more than 30 minutes.  Do not stay in bed.  Do not avoid exercise or work.  Activity can help your back heal faster.  Be careful when you bend or lift an object.  Bend at your knees, keep the object close to you, and do not twist.  Sleep on a firm mattress.  Lie on your side, and bend your knees.  If you lie on your back, put a pillow under your knees.  Only take medicines as told by your doctor.  Put ice on the injured area.  Put ice in a plastic bag  Place a towel between your skin and the bag  Leave the ice on for 15-20 minutes, 3-4 times a day for the first 2-3 days. 210 After that, you can switch between ice and heat packs.  Ask your doctor about back exercises or massage.  Avoid feeling anxious or stressed.  Find good ways to deal with stress, such as  exercise.  Get Help Right Way If:  Your pain does not go away with rest or medicine.  Your pain does not go away in 1 week.  You have new problems.  You do not feel well.  The pain spreads into your legs.  You cannot control when you poop (bowel movement) or pee (urinate)  You feel sick to your stomach (nauseous) or throw up (vomit)  You have belly (abdominal) pain.  You feel like you may pass out (faint).  If you develop a fever.  Make Sure you:  Understand these instructions.  Will watch your condition  Will get help right away if you are not doing well or get worse.  Your e-visit answers were reviewed by a board certified advanced clinical practitioner to complete your personal care plan.  Depending on the condition, your plan could have included both over the counter or prescription medications.  If there is a problem please reply  once you have received a response from your provider.  Your safety is important to [place].  If you have drug allergies check your prescription carefully.    You can use MyChart to ask questions about today's visit, request a non-urgent call back, or ask for a work or school excuse for  24 hours related to this e-Visit. If it has been greater than 24 hours you will need to follow up with your provider, or enter a new e-Visit to address those concerns.  You will get an e-mail in the next two days asking about your experience.  I hope that your e-visit has been valuable and will speed your recovery. Thank you for using e-visits.

## 2019-03-19 ENCOUNTER — Encounter: Payer: Self-pay | Admitting: Sports Medicine

## 2019-03-19 ENCOUNTER — Other Ambulatory Visit: Payer: Self-pay | Admitting: Neurology

## 2019-03-19 ENCOUNTER — Other Ambulatory Visit: Payer: Self-pay

## 2019-03-19 ENCOUNTER — Ambulatory Visit (HOSPITAL_BASED_OUTPATIENT_CLINIC_OR_DEPARTMENT_OTHER)
Admission: RE | Admit: 2019-03-19 | Discharge: 2019-03-19 | Disposition: A | Discharge status: Home / Self Care | Payer: 59 | Source: Ambulatory Visit | Attending: Sports Medicine | Admitting: Sports Medicine

## 2019-03-19 ENCOUNTER — Telehealth (INDEPENDENT_AMBULATORY_CARE_PROVIDER_SITE_OTHER): Payer: 59 | Admitting: Sports Medicine

## 2019-03-19 DIAGNOSIS — M5030 Other cervical disc degeneration, unspecified cervical region: Secondary | ICD-10-CM

## 2019-03-19 DIAGNOSIS — R7301 Impaired fasting glucose: Secondary | ICD-10-CM

## 2019-03-19 DIAGNOSIS — M542 Cervicalgia: Secondary | ICD-10-CM | POA: Diagnosis not present

## 2019-03-19 MED ORDER — CYCLOBENZAPRINE HCL 10 MG PO TABS: ORAL_TABLET | ORAL | 0 refills | Status: DC

## 2019-03-19 MED ORDER — PREDNISONE 50 MG PO TABS: ORAL_TABLET | ORAL | 0 refills | Status: DC

## 2019-03-19 MED ORDER — METFORMIN HCL 500 MG PO TABS: 500.0000 mg | ORAL_TABLET | Freq: Every day | ORAL | 0 refills | Status: DC

## 2019-03-19 MED FILL — metFORMIN HCL 500 MG TABS: 500 | 90 days supply | Qty: 90 | Fill #0

## 2019-03-19 MED FILL — CYCLOBENZAPRINE HCL 10 MG T: 10 | 10 days supply | Qty: 30 | Fill #0

## 2019-03-19 MED FILL — predniSONE 50 MG TABS: 50 | 5 days supply | Qty: 5 | Fill #0

## 2019-03-19 NOTE — Assessment & Plan Note (Signed)
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 Now with severe pain, spasm, axial and right-sided without a radicular component. No better with naproxen and baclofen prescribed in another virtual visit. Discontinue both of the above, adding 5 days of prednisone, Flexeril full dose at bedtime, updated x-rays, formal physical therapy. Return to see me in 1 month, MRI for interventional planning if no better.

## 2019-03-19 NOTE — Progress Notes (Signed)
Virtual Visit via WebEx/MyChart   I connected with  Janann Colonel  on 03/19/19 via WebEx/MyChart/Doximity Video and verified that I am speaking with the correct person using two identifiers.   I discussed the limitations, risks, security and privacy concerns of performing an evaluation and management service by WebEx/MyChart/Doximity Video, including the higher likelihood of inaccurate diagnosis and treatment, and the availability of in person appointments.  We also discussed the likely need of an additional face to face encounter for complete and high quality delivery of care.  I also discussed with the patient that there may be a patient responsible charge related to this service. The patient expressed understanding and wishes to proceed.  Provider location is either at home or medical facility. Patient location is at their home, different from provider location. People involved in care of the patient during this telehealth encounter were myself, my nurse/medical assistant, and my front office/scheduling team member.  Subjective:    CC: Neck pain  HPI: For the past several weeks Pricilla has had severe right-sided neck pain, she has great difficulty looking up, and turning to the right.  Pain is mostly axial, nothing really radiating down to the arms or fingertips, no trauma, no progressive weakness.  I reviewed the past medical history, family history, social history, surgical history, and allergies today and no changes were needed.  Please see the problem list section below in epic for further details.  Past Medical History: Past Medical History:  Diagnosis Date  . Anxiety    Past Surgical History: Past Surgical History:  Procedure Laterality Date  . CESAREAN SECTION    . CHOLECYSTECTOMY     Social History: Social History   Socioeconomic History  . Marital status: Married    Spouse name: Ronalee Belts  . Number of children: Not on file  . Years of education: Not on file  .  Highest education level: Not on file  Occupational History    Employer: [email or web address].    Comment: [place]  Social Needs  . Financial resource strain: Not on file  . Food insecurity    Worry: Not on file    Inability: Not on file  . Transportation needs    Medical: Not on file    Non-medical: Not on file  Tobacco Use  . Smoking status: Former Smoker    Types: Cigarettes  . Smokeless tobacco: Never Used  . Tobacco comment: Says smoked socially for about 3 years when younger  Substance and Sexual Activity  . Alcohol use: Yes    Comment: rarely  . Drug use: Yes  . Sexual activity: Yes    Partners: Male  Lifestyle  . Physical activity    Days per week: Not on file    Minutes per session: Not on file  . Stress: Not on file  Relationships  . Social Herbalist on phone: Not on file    Gets together: Not on file    Attends religious service: Not on file    Active member of club or organization: Not on file    Attends meetings of clubs or organizations: Not on file    Relationship status: Not on file  Other Topics Concern  . Not on file  Social History Narrative   Some exercise.     Family History: Family History  Problem Relation Age of Onset  . Diabetes Unknown        fam hx  . Hyperlipidemia Unknown  fam hx  . Hypertension Unknown        fam hx  . Heart disease Unknown        fam hx  . Heart attack Father   . Hyperlipidemia Father   . Hypertension Father    Allergies: Allergies  Allergen Reactions  . Lisinopril     Swelling  Per patient history    Medications: See med rec.  Review of Systems: No fevers, chills, night sweats, weight loss, chest pain, or shortness of breath.   Objective:    General: Speaking full sentences, no audible heavy breathing.  Sounds alert and appropriately interactive.  Appears well.  Face symmetric.  Extraocular movements intact.  Pupils equal and round.  No nasal flaring or accessory muscle use  visualized.  No other physical exam performed due to the non-physical nature of this visit.  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 Now with severe pain, spasm, axial and right-sided without a radicular component. No better with naproxen and baclofen prescribed in another virtual visit. Discontinue both of the above, adding 5 days of prednisone, Flexeril full dose at bedtime, updated x-rays, formal physical therapy. Return to see me in 1 month, MRI for interventional planning if no better.  I discussed the above assessment and treatment plan with the patient. The patient was provided an opportunity to ask questions and all were answered. The patient agreed with the plan and demonstrated an understanding of the instructions.   The patient was advised to call back or seek an in-person evaluation if the symptoms worsen or if the condition fails to improve as anticipated.   I provided 25 minutes of non-face-to-face time during this encounter, 15 minutes of additional time was needed to gather information, review chart, records, communicate/coordinate with staff remotely, troubleshooting the multiple errors that we get every time when trying to do video calls through the electronic medical record, WebEx, and Doximity, restart the encounter multiple times due to instability of the software, as well as complete documentation.   ___________________________________________ Gwen Her. Dianah Field, M.D., ABFM., CAQSM. Primary Care and Sports Medicine [facility]  Adjunct Professor of [place] [employer] [facility] [employer]

## 2019-03-20 ENCOUNTER — Encounter: Payer: Self-pay | Admitting: Sports Medicine

## 2019-04-04 MED FILL — HYDROCHLOROTHIAZIDE 25 MG T: 25 | 90 days supply | Qty: 90 | Fill #1

## 2019-04-04 MED FILL — PANTOPRAZOLE SOD DR 40 MG T: 40 | 90 days supply | Qty: 90 | Fill #1

## 2019-05-01 DIAGNOSIS — H524 Presbyopia: Secondary | ICD-10-CM | POA: Diagnosis not present

## 2019-05-01 LAB — HM DIABETES EYE EXAM

## 2019-05-31 ENCOUNTER — Telehealth: Payer: Self-pay | Admitting: Physician Assistant

## 2019-05-31 DIAGNOSIS — R7301 Impaired fasting glucose: Secondary | ICD-10-CM

## 2019-05-31 DIAGNOSIS — E782 Mixed hyperlipidemia: Secondary | ICD-10-CM

## 2019-05-31 DIAGNOSIS — D509 Iron deficiency anemia, unspecified: Secondary | ICD-10-CM

## 2019-05-31 DIAGNOSIS — I10 Essential (primary) hypertension: Secondary | ICD-10-CM

## 2019-05-31 DIAGNOSIS — Z0000 Encounter for general adult medical examination without abnormal findings: Secondary | ICD-10-CM

## 2019-05-31 NOTE — Telephone Encounter (Signed)
Patient is wanting her labs before her physical on 10/15, Wednesday. She would like for [facility] to [facility]. Would like to go get them down on Monday. Please advise.

## 2019-05-31 NOTE — Telephone Encounter (Signed)
Labs pended.

## 2019-06-11 ENCOUNTER — Encounter: Payer: Self-pay | Admitting: Physician Assistant

## 2019-06-12 ENCOUNTER — Encounter: Payer: 59 | Admitting: Physician Assistant

## 2019-06-18 ENCOUNTER — Other Ambulatory Visit: Payer: Self-pay | Admitting: Physician Assistant

## 2019-06-18 ENCOUNTER — Telehealth: Payer: Self-pay | Admitting: Physician Assistant

## 2019-06-18 ENCOUNTER — Encounter: Payer: 59 | Admitting: Physician Assistant

## 2019-06-18 DIAGNOSIS — Z1231 Encounter for screening mammogram for malignant neoplasm of breast: Secondary | ICD-10-CM

## 2019-06-18 DIAGNOSIS — R7301 Impaired fasting glucose: Secondary | ICD-10-CM

## 2019-06-18 MED FILL — metFORMIN HCL 500 MG TABS: 500 | 30 days supply | Qty: 30 | Fill #0

## 2019-06-18 NOTE — Telephone Encounter (Signed)
RX was sent this morning. LMOM making patient aware.

## 2019-06-18 NOTE — Telephone Encounter (Signed)
Patient needs a refill on her Metformin and has an appointment with Luvenia Starch next week. Can you refill this for her? Thank you

## 2019-06-20 ENCOUNTER — Ambulatory Visit (INDEPENDENT_AMBULATORY_CARE_PROVIDER_SITE_OTHER): Payer: 59

## 2019-06-20 ENCOUNTER — Other Ambulatory Visit: Payer: Self-pay

## 2019-06-20 DIAGNOSIS — Z1231 Encounter for screening mammogram for malignant neoplasm of breast: Secondary | ICD-10-CM | POA: Diagnosis not present

## 2019-06-21 NOTE — Progress Notes (Signed)
Normal mammogram. Follow up in 1 year.

## 2019-06-24 DIAGNOSIS — E782 Mixed hyperlipidemia: Secondary | ICD-10-CM | POA: Diagnosis not present

## 2019-06-24 DIAGNOSIS — I10 Essential (primary) hypertension: Secondary | ICD-10-CM | POA: Diagnosis not present

## 2019-06-24 DIAGNOSIS — D509 Iron deficiency anemia, unspecified: Secondary | ICD-10-CM | POA: Diagnosis not present

## 2019-06-24 DIAGNOSIS — R7301 Impaired fasting glucose: Secondary | ICD-10-CM | POA: Diagnosis not present

## 2019-06-24 DIAGNOSIS — Z0000 Encounter for general adult medical examination without abnormal findings: Secondary | ICD-10-CM | POA: Diagnosis not present

## 2019-06-25 ENCOUNTER — Encounter: Payer: Self-pay | Admitting: Physician Assistant

## 2019-06-25 ENCOUNTER — Ambulatory Visit (INDEPENDENT_AMBULATORY_CARE_PROVIDER_SITE_OTHER): Payer: 59 | Admitting: Physician Assistant

## 2019-06-25 ENCOUNTER — Other Ambulatory Visit: Payer: Self-pay

## 2019-06-25 VITALS — BP 138/85 | HR 72 | Ht 60.0 in | Wt 171.0 lb

## 2019-06-25 DIAGNOSIS — E876 Hypokalemia: Secondary | ICD-10-CM | POA: Diagnosis not present

## 2019-06-25 DIAGNOSIS — Z6833 Body mass index (BMI) 33.0-33.9, adult: Secondary | ICD-10-CM | POA: Diagnosis not present

## 2019-06-25 DIAGNOSIS — E6609 Other obesity due to excess calories: Secondary | ICD-10-CM | POA: Diagnosis not present

## 2019-06-25 DIAGNOSIS — I10 Essential (primary) hypertension: Secondary | ICD-10-CM | POA: Diagnosis not present

## 2019-06-25 DIAGNOSIS — E1165 Type 2 diabetes mellitus with hyperglycemia: Secondary | ICD-10-CM | POA: Diagnosis not present

## 2019-06-25 DIAGNOSIS — K219 Gastro-esophageal reflux disease without esophagitis: Secondary | ICD-10-CM

## 2019-06-25 DIAGNOSIS — Z0000 Encounter for general adult medical examination without abnormal findings: Secondary | ICD-10-CM

## 2019-06-25 DIAGNOSIS — F411 Generalized anxiety disorder: Secondary | ICD-10-CM | POA: Diagnosis not present

## 2019-06-25 DIAGNOSIS — E782 Mixed hyperlipidemia: Secondary | ICD-10-CM

## 2019-06-25 DIAGNOSIS — E119 Type 2 diabetes mellitus without complications: Secondary | ICD-10-CM | POA: Insufficient documentation

## 2019-06-25 LAB — CBC WITH DIFFERENTIAL/PLATELET
Absolute Monocytes: 494 cells/uL (ref 200–950)
Basophils Absolute: 33 cells/uL (ref 0–200)
Basophils Relative: 0.5 %
Eosinophils Absolute: 59 cells/uL (ref 15–500)
Eosinophils Relative: 0.9 %
HCT: 41.8 % (ref 35.0–45.0)
Hemoglobin: 14.1 g/dL (ref 11.7–15.5)
Lymphs Abs: 2652 cells/uL (ref 850–3900)
MCH: 29.7 pg (ref 27.0–33.0)
MCHC: 33.7 g/dL (ref 32.0–36.0)
MCV: 88 fL (ref 80.0–100.0)
MPV: 10.2 fL (ref 7.5–12.5)
Monocytes Relative: 7.6 %
Neutro Abs: 3263 cells/uL (ref 1500–7800)
Neutrophils Relative %: 50.2 %
Platelets: 328 10*3/uL (ref 140–400)
RBC: 4.75 10*6/uL (ref 3.80–5.10)
RDW: 12.5 % (ref 11.0–15.0)
Total Lymphocyte: 40.8 %
WBC: 6.5 10*3/uL (ref 3.8–10.8)

## 2019-06-25 LAB — COMPLETE METABOLIC PANEL WITH GFR
AG Ratio: 1.7 (calc) (ref 1.0–2.5)
ALT: 31 U/L — ABNORMAL HIGH (ref 6–29)
AST: 20 U/L (ref 10–35)
Albumin: 4.3 g/dL (ref 3.6–5.1)
Alkaline phosphatase (APISO): 55 U/L (ref 37–153)
BUN: 13 mg/dL (ref 7–25)
CO2: 28 mmol/L (ref 20–32)
Calcium: 9.6 mg/dL (ref 8.6–10.4)
Chloride: 97 mmol/L — ABNORMAL LOW (ref 98–110)
Creat: 0.72 mg/dL (ref 0.50–1.05)
GFR, Est African American: 112 mL/min/{1.73_m2} (ref >=60)
GFR, Est Non African American: 96 mL/min/{1.73_m2} (ref >=60)
Globulin: 2.6 g/dL (calc) (ref 1.9–3.7)
Glucose, Bld: 100 mg/dL — ABNORMAL HIGH (ref 65–99)
Potassium: 3.2 mmol/L — ABNORMAL LOW (ref 3.5–5.3)
Sodium: 136 mmol/L (ref 135–146)
Total Bilirubin: 0.5 mg/dL (ref 0.2–1.2)
Total Protein: 6.9 g/dL (ref 6.1–8.1)

## 2019-06-25 LAB — LIPID PANEL W/REFLEX DIRECT LDL
Cholesterol: 235 mg/dL — ABNORMAL HIGH (ref <200)
HDL: 51 mg/dL (ref >=50)
LDL Cholesterol (Calc): 158 mg/dL (calc) — ABNORMAL HIGH
Non-HDL Cholesterol (Calc): 184 mg/dL (calc) — ABNORMAL HIGH (ref <130)
Total CHOL/HDL Ratio: 4.6 (calc) (ref <5.0)
Triglycerides: 136 mg/dL (ref <150)

## 2019-06-25 LAB — HEMOGLOBIN A1C
Hgb A1c MFr Bld: 6.6 % of total Hgb — ABNORMAL HIGH (ref <5.7)
Mean Plasma Glucose: 143 (calc)
eAG (mmol/L): 7.9 (calc)

## 2019-06-25 MED ORDER — NAPROXEN 500 MG PO TABS: 500.0000 mg | ORAL_TABLET | Freq: Two times a day (BID) | ORAL | 0 refills | Status: DC

## 2019-06-25 MED ORDER — SERTRALINE HCL 50 MG PO TABS: ORAL_TABLET | ORAL | 1 refills | Status: DC

## 2019-06-25 MED ORDER — PANTOPRAZOLE SODIUM 40 MG PO TBEC: 40.0000 mg | DELAYED_RELEASE_TABLET | Freq: Every day | ORAL | 3 refills | Status: DC

## 2019-06-25 MED ORDER — OZEMPIC (0.25 OR 0.5 MG/DOSE) 2 MG/1.5ML ~~LOC~~ SOPN: 0.5000 mg | PEN_INJECTOR | SUBCUTANEOUS | 2 refills | Status: DC

## 2019-06-25 MED ORDER — ATORVASTATIN CALCIUM 40 MG PO TABS: 40.0000 mg | ORAL_TABLET | Freq: Every day | ORAL | 3 refills | Status: DC

## 2019-06-25 MED ORDER — OLMESARTAN MEDOXOMIL-HCTZ 40-25 MG PO TABS: 1.0000 | ORAL_TABLET | Freq: Every day | ORAL | 0 refills | Status: DC

## 2019-06-25 MED FILL — ATORVASTATIN 40 MG TABLET: 40 | 90 days supply | Qty: 90 | Fill #0

## 2019-06-25 MED FILL — NAPROXEN 500 MG TABS: 500 | 15 days supply | Qty: 30 | Fill #0

## 2019-06-25 MED FILL — PANTOPRAZOLE SOD DR 40 MG T: 40 | 90 days supply | Qty: 90 | Fill #0

## 2019-06-25 MED FILL — OLMESARTAN-HCTZ 40-25 MG TA: 40-25 | 90 days supply | Qty: 90 | Fill #0

## 2019-06-25 MED FILL — SERTRALINE HCL 50 MG TABLET: 50 | 90 days supply | Qty: 135 | Fill #0

## 2019-06-25 MED FILL — OZEMPIC 0.25 OR 0.5 MG/DOSE: 2 | 28 days supply | Qty: 2 | Fill #0

## 2019-06-25 NOTE — Progress Notes (Signed)
Subjective:    Patient ID: Kristen Bright, female    DOB: March 05, 1966, 53 y.o.   MRN: [id number]  HPI  Pt is a 53 yo female who presents to the clinic for annual exam.   No current problems but she does want to be healthier.   .. Active Ambulatory Problems    Diagnosis Date Noted  . 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 ELEVATED BLOOD PRESSURE WITHOUT DIAGNOSIS OF HYPERTENSION 11/09/2010  . IFG (impaired fasting glucose) 05/28/2013  . OSA (obstructive sleep apnea) 09/30/2013  . Anemia, iron deficiency 10/02/2013  . Low iron stores 10/02/2013  . Heart murmur 06/24/2014  . Fatty liver disease, nonalcoholic [id number]  . Class 1 obesity due to excess calories with serious comorbidity and body mass index (BMI) of 33.0 to 33.9 in adult 11/04/2014  . BMI 34.0-34.9,adult 11/04/2014  . Abnormal weight gain 11/04/2014  . CN (constipation) 05/06/2015  . Stress 09/14/2015  . Anxiety state 09/14/2015  . Essential hypertension, benign 09/14/2015  . DDD (degenerative disc disease), cervical 08/18/2016  . Mixed hyperlipidemia 01/18/2017  . Memory changes 11/19/2017  . Type 2 diabetes mellitus with hyperglycemia, without long-term current use of insulin ([place]) 06/25/2019  . Hypokalemia 06/26/2019  . Gastroesophageal reflux disease 06/26/2019   Resolved Ambulatory Problems    Diagnosis Date Noted  . ANXIETY 02/28/2010  . Abnormal weight loss 05/06/2015   Past Medical History:  Diagnosis Date  . Anxiety    .Marland Kitchen Family History  Problem Relation Age of Onset  . Diabetes Other        fam hx  . Hyperlipidemia Other        fam hx  . Hypertension Other        fam hx  . Heart disease Other        fam hx  . Heart attack Father   . Hyperlipidemia Father   . Hypertension Father    .Marland Kitchen Social History   Socioeconomic History  . Marital status: Married    Spouse name: Kristen Bright  . Number of children: Not on file  . Years of education: Not on file  . Highest education level: Not on file   Occupational History    Employer: [email or web address].    Comment:   Social Needs  . Financial resource strain: Not on file  . Food insecurity    Worry: Not on file    Inability: Not on file  . Transportation needs    Medical: Not on file    Non-medical: Not on file  Tobacco Use  . Smoking status: Former Smoker    Types: Cigarettes  . Smokeless tobacco: Never Used  . Tobacco comment: Says smoked socially for about 3 years when younger  Substance and Sexual Activity  . Alcohol use: Yes    Comment: rarely  . Drug use: Yes  . Sexual activity: Yes    Partners: Male  Lifestyle  . Physical activity    Days per week: Not on file    Minutes per session: Not on file  . Stress: Not on file  Relationships  . Social Herbalist on phone: Not on file    Gets together: Not on file    Attends religious service: Not on file    Active member of club or organization: Not on file    Attends meetings of clubs or organizations: Not on file    Relationship status: Not on file  . Intimate partner violence  Fear of current or ex partner: Not on file    Emotionally abused: Not on file    Physically abused: Not on file    Forced sexual activity: Not on file  Other Topics Concern  . Not on file  Social History Narrative   Some exercise.        Review of Systems  All other systems reviewed and are negative.      Objective:   Physical Exam BP 138/85   Pulse 72   Ht 5' (1.524 m)   Wt 171 lb (77.6 kg)   SpO2 100%   BMI 33.40 kg/m   General Appearance:    Alert, cooperative, no distress, appears stated age, obese  Head:    Normocephalic, without obvious abnormality, atraumatic  Eyes:    PERRL, conjunctiva/corneas clear, EOM's intact, fundi    benign, both eyes  Ears:    Normal TM's and external ear canals, both ears  Nose:   Nares normal, septum midline, mucosa normal, no drainage    or sinus tenderness  Throat:   Lips, mucosa, and tongue normal; teeth  and gums normal  Neck:   Supple, symmetrical, trachea midline, no adenopathy;    thyroid:  no enlargement/tenderness/nodules; no carotid   bruit or JVD  Back:     Symmetric, no curvature, ROM normal, no CVA tenderness  Lungs:     Clear to auscultation bilaterally, respirations unlabored  Chest Wall:    No tenderness or deformity   Heart:    Regular rate and rhythm, S1 and S2 normal, no murmur, rub   or gallop  Breast Exam:    No tenderness, masses, or nipple abnormality  Abdomen:     Soft, non-tender, bowel sounds active all four quadrants,    no masses, no organomegaly        Extremities:   Extremities normal, atraumatic, no cyanosis or edema  Pulses:   2+ and symmetric all extremities  Skin:   Skin color, texture, turgor normal, no rashes or lesions  Lymph nodes:   Cervical, supraclavicular, and axillary nodes normal  Neurologic:   CNII-XII intact, normal strength, sensation and reflexes    throughout        .Marland Kitchen Depression screen [facility] 2/9 06/25/2019 11/14/2017 01/17/2017  Decreased Interest 0 0 0  Down, Depressed, Hopeless 0 0 0  PHQ - 2 Score 0 0 0  Altered sleeping 0 0 -  Tired, decreased energy 0 0 -  Change in appetite 0 0 -  Feeling bad or failure about yourself  0 0 -  Trouble concentrating 0 1 -  Moving slowly or fidgety/restless 0 0 -  Suicidal thoughts 0 0 -  PHQ-9 Score 0 1 -  Difficult doing work/chores Not difficult at all Not difficult at all -   .. GAD 7 : Generalized Anxiety Score 06/25/2019 11/14/2017  Nervous, Anxious, on Edge 0 0  Control/stop worrying 0 0  Worry too much - different things 0 0  Trouble relaxing 1 0  Restless 0 0  Easily annoyed or irritable 0 0  Afraid - awful might happen 0 0  Total GAD 7 Score 1 0  Anxiety Difficulty Not difficult at all Not difficult at all     Assessment & Plan:  Marland KitchenMarland KitchenLillymae was seen today for annual exam.  Diagnoses and all orders for this visit:  Routine physical examination  Class 1 obesity due to excess  calories with serious comorbidity and body mass index (BMI) of 33.0 to  33.9 in adult  Type 2 diabetes mellitus with hyperglycemia, without long-term current use of insulin (HCC) -     Semaglutide,0.25 or 0.5MG /DOS, (OZEMPIC, 0.25 OR 0.5 MG/DOSE,) 2 MG/1.5ML SOPN; Inject 0.5 mg into the skin once a week.  Essential hypertension, benign -     olmesartan-hydrochlorothiazide (BENICAR HCT) 40-25 MG tablet; Take 1 tablet by mouth daily.  GAD (generalized anxiety disorder) -     sertraline (ZOLOFT) 50 MG tablet; TAKE 1 & 1/2 TABLETS (75 MG TOTAL) BY MOUTH DAILY.  Mixed hyperlipidemia -     atorvastatin (LIPITOR) 40 MG tablet; Take 1 tablet (40 mg total) by mouth daily.  Gastroesophageal reflux disease, unspecified whether esophagitis present -     pantoprazole (PROTONIX) 40 MG tablet; Take 1 tablet (40 mg total) by mouth daily.  Hypokalemia  Other orders -     naproxen (NAPROSYN) 500 MG tablet; Take 1 tablet (500 mg total) by mouth 2 (two) times daily with a meal.   .. Discussed 150 minutes of exercise a week.  Encouraged vitamin D 1000 units and Calcium 1300mg  or 4 servings of dairy a day.  PHQ done WNL. Fasting labs done and will discuss.  Mammogram UTD. Pap not done. Per patient will schedule appt.  Colonoscopy UTD per patient 2019. [facility] will call to get record of this.  Flu shot UTD. Pt declined shingles vaccine today.   A1C has increased to 6.6.  Pt has some GI issues with metformin. Stop it.  Start ozempic. Pt declined any hx of pancreatitis or personal/family hx of thyroid cancer. Discussed how to use and given coupon card. Discussed side effects.  Encouraged diabetic diet and regular exercise.  BP not to goal. On HCTZ could be causing hypokalemia. Added benicar due to allergy to lisinopril to HCTZ.  LDL not to goal. Added lipitor.  Pt declined pneumonia vaccine today.  Follow up in 3 months.   Monitor BP over next 2 weeks and report readings. Recheck potassium in 2-4  weeks.   Start one medication at a time

## 2019-06-25 NOTE — Patient Instructions (Signed)
Health Maintenance, Female Adopting a healthy lifestyle and getting preventive care are important in promoting health and wellness. Ask your health care provider about:  The right schedule for you to have regular tests and exams.  Things you can do on your own to prevent diseases and keep yourself healthy. What should I know about diet, weight, and exercise? Eat a healthy diet   Eat a diet that includes plenty of vegetables, fruits, low-fat dairy products, and lean protein.  Do not eat a lot of foods that are high in solid fats, added sugars, or sodium. Maintain a healthy weight Body mass index (BMI) is used to identify weight problems. It estimates body fat based on height and weight. Your health care provider can help determine your BMI and help you achieve or maintain a healthy weight. Get regular exercise Get regular exercise. This is one of the most important things you can do for your health. Most adults should:  Exercise for at least 150 minutes each week. The exercise should increase your heart rate and make you sweat (moderate-intensity exercise).  Do strengthening exercises at least twice a week. This is in addition to the moderate-intensity exercise.  Spend less time sitting. Even light physical activity can be beneficial. Watch cholesterol and blood lipids Have your blood tested for lipids and cholesterol at 52 years of age, then have this test every 5 years. Have your cholesterol levels checked more often if:  Your lipid or cholesterol levels are high.  You are older than 52 years of age.  You are at high risk for heart disease. What should I know about cancer screening? Depending on your health history and family history, you may need to have cancer screening at various ages. This may include screening for:  Breast cancer.  Cervical cancer.  Colorectal cancer.  Skin cancer.  Lung cancer. What should I know about heart disease, diabetes, and high blood  pressure? Blood pressure and heart disease  High blood pressure causes heart disease and increases the risk of stroke. This is more likely to develop in people who have high blood pressure readings, are of African descent, or are overweight.  Have your blood pressure checked: ? Every 3-5 years if you are 18-39 years of age. ? Every year if you are 40 years old or older. Diabetes Have regular diabetes screenings. This checks your fasting blood sugar level. Have the screening done:  Once every three years after age 40 if you are at a normal weight and have a low risk for diabetes.  More often and at a younger age if you are overweight or have a high risk for diabetes. What should I know about preventing infection? Hepatitis B If you have a higher risk for hepatitis B, you should be screened for this virus. Talk with your health care provider to find out if you are at risk for hepatitis B infection. Hepatitis C Testing is recommended for:  Everyone born from 1945 through 1965.  Anyone with known risk factors for hepatitis C. Sexually transmitted infections (STIs)  Get screened for STIs, including gonorrhea and chlamydia, if: ? You are sexually active and are younger than 52 years of age. ? You are older than 52 years of age and your health care provider tells you that you are at risk for this type of infection. ? Your sexual activity has changed since you were last screened, and you are at increased risk for chlamydia or gonorrhea. Ask your health care provider if   you are at risk.  Ask your health care provider about whether you are at high risk for HIV. Your health care provider may recommend a prescription medicine to help prevent HIV infection. If you choose to take medicine to prevent HIV, you should first get tested for HIV. You should then be tested every 3 months for as long as you are taking the medicine. Pregnancy  If you are about to stop having your period (premenopausal) and  you may become pregnant, seek counseling before you get pregnant.  Take 400 to 800 micrograms (mcg) of folic acid every day if you become pregnant.  Ask for birth control (contraception) if you want to prevent pregnancy. Osteoporosis and menopause Osteoporosis is a disease in which the bones lose minerals and strength with aging. This can result in bone fractures. If you are 65 years old or older, or if you are at risk for osteoporosis and fractures, ask your health care provider if you should:  Be screened for bone loss.  Take a calcium or vitamin D supplement to lower your risk of fractures.  Be given hormone replacement therapy (HRT) to treat symptoms of menopause. Follow these instructions at home: Lifestyle  Do not use any products that contain nicotine or tobacco, such as cigarettes, e-cigarettes, and chewing tobacco. If you need help quitting, ask your health care provider.  Do not use street drugs.  Do not share needles.  Ask your health care provider for help if you need support or information about quitting drugs. Alcohol use  Do not drink alcohol if: ? Your health care provider tells you not to drink. ? You are pregnant, may be pregnant, or are planning to become pregnant.  If you drink alcohol: ? Limit how much you use to 0-1 drink a day. ? Limit intake if you are breastfeeding.  Be aware of how much alcohol is in your drink. In the [place] one drink equals one 12 oz bottle of beer (355 mL), one 5 oz glass of wine (148 mL), or one 1 oz glass of hard liquor (44 mL). General instructions  Schedule regular health, dental, and eye exams.  Stay current with your vaccines.  Tell your health care provider if: ? You often feel depressed. ? You have ever been abused or do not feel safe at home. Summary  Adopting a healthy lifestyle and getting preventive care are important in promoting health and wellness.  Follow your health care provider's instructions about healthy  diet, exercising, and getting tested or screened for diseases.  Follow your health care provider's instructions on monitoring your cholesterol and blood pressure. This information is not intended to replace advice given to you by your health care provider. Make sure you discuss any questions you have with your health care provider. Document Released: 02/28/2011 Document Revised: 08/08/2018 Document Reviewed: 08/08/2018 Elsevier Patient Education  2020 [employer]  

## 2019-06-26 DIAGNOSIS — K219 Gastro-esophageal reflux disease without esophagitis: Secondary | ICD-10-CM | POA: Insufficient documentation

## 2019-06-26 DIAGNOSIS — E876 Hypokalemia: Secondary | ICD-10-CM | POA: Insufficient documentation

## 2019-07-26 MED FILL — OZEMPIC 0.25 OR 0.5 MG/DOSE: 2 | 28 days supply | Qty: 2 | Fill #1

## 2019-07-30 DIAGNOSIS — M546 Pain in thoracic spine: Secondary | ICD-10-CM | POA: Diagnosis not present

## 2019-07-30 DIAGNOSIS — M542 Cervicalgia: Secondary | ICD-10-CM | POA: Diagnosis not present

## 2019-07-30 DIAGNOSIS — M9902 Segmental and somatic dysfunction of thoracic region: Secondary | ICD-10-CM | POA: Diagnosis not present

## 2019-07-30 DIAGNOSIS — M9901 Segmental and somatic dysfunction of cervical region: Secondary | ICD-10-CM | POA: Diagnosis not present

## 2019-07-30 DIAGNOSIS — M7918 Myalgia, other site: Secondary | ICD-10-CM | POA: Diagnosis not present

## 2019-08-21 MED FILL — OZEMPIC 0.25 OR 0.5 MG/DOSE: 2 | 28 days supply | Qty: 2 | Fill #2

## 2019-09-20 ENCOUNTER — Other Ambulatory Visit: Payer: Self-pay

## 2019-09-20 ENCOUNTER — Ambulatory Visit (INDEPENDENT_AMBULATORY_CARE_PROVIDER_SITE_OTHER): Payer: No Typology Code available for payment source | Admitting: Physician Assistant

## 2019-09-20 VITALS — BP 118/67 | HR 72 | Ht 60.0 in | Wt 164.0 lb

## 2019-09-20 DIAGNOSIS — Z6832 Body mass index (BMI) 32.0-32.9, adult: Secondary | ICD-10-CM

## 2019-09-20 DIAGNOSIS — F439 Reaction to severe stress, unspecified: Secondary | ICD-10-CM

## 2019-09-20 DIAGNOSIS — E1165 Type 2 diabetes mellitus with hyperglycemia: Secondary | ICD-10-CM | POA: Diagnosis not present

## 2019-09-20 DIAGNOSIS — Z8616 Personal history of COVID-19: Secondary | ICD-10-CM

## 2019-09-20 DIAGNOSIS — I10 Essential (primary) hypertension: Secondary | ICD-10-CM | POA: Diagnosis not present

## 2019-09-20 DIAGNOSIS — E782 Mixed hyperlipidemia: Secondary | ICD-10-CM

## 2019-09-20 DIAGNOSIS — E6609 Other obesity due to excess calories: Secondary | ICD-10-CM

## 2019-09-20 DIAGNOSIS — E876 Hypokalemia: Secondary | ICD-10-CM

## 2019-09-20 LAB — POCT GLYCOSYLATED HEMOGLOBIN (HGB A1C): Hemoglobin A1C: 6 % — AB (ref 4.0–5.6)

## 2019-09-20 MED ORDER — OZEMPIC (0.25 OR 0.5 MG/DOSE) 2 MG/1.5ML ~~LOC~~ SOPN: 0.5000 mg | PEN_INJECTOR | SUBCUTANEOUS | 1 refills | Status: DC

## 2019-09-20 MED ORDER — OLMESARTAN MEDOXOMIL-HCTZ 40-25 MG PO TABS: 1.0000 | ORAL_TABLET | Freq: Every day | ORAL | 3 refills | Status: DC

## 2019-09-20 MED FILL — OLMESARTAN-HCTZ 40-25 MG TA: 40-25 | 90 days supply | Qty: 90 | Fill #0

## 2019-09-20 MED FILL — OZEMPIC 0.25 OR 0.5 MG/DOSE: 2 | 84 days supply | Qty: 5 | Fill #0

## 2019-09-20 NOTE — Progress Notes (Signed)
Subjective:    Patient ID: Kristen Bright, female    DOB: 04-24-1966, 54 y.o.   MRN: [id number]  HPI  Pt is a 54 yo female with HTN, OSA, GERD, T2DM, HLD who presents to the clinic for 3 month follow up.   Pt is not checking sugars at home. Her last a1c was 6.6. she denies any hypoglycemic events. She is on ozempic and loves it. She feels like curbs appetitie. No GI side effects. Down from 171 to 164 despite the holidays and covid infection. She denies any vision changes, CP, palpitations, headaches.   Pt had covid December 3rd. Doing well. No signficant symptoms except lots of fatigue.   Her father also pasted away.    .. Active Ambulatory Problems    Diagnosis Date Noted  . 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 ELEVATED BLOOD PRESSURE WITHOUT DIAGNOSIS OF HYPERTENSION 11/09/2010  . IFG (impaired fasting glucose) 05/28/2013  . OSA (obstructive sleep apnea) 09/30/2013  . Anemia, iron deficiency 10/02/2013  . Low iron stores 10/02/2013  . Heart murmur 06/24/2014  . Fatty liver disease, nonalcoholic [id number]  . Abnormal weight gain 11/04/2014  . CN (constipation) 05/06/2015  . Stress 09/14/2015  . Anxiety state 09/14/2015  . Essential hypertension, benign 09/14/2015  . DDD (degenerative disc disease), cervical 08/18/2016  . Mixed hyperlipidemia 01/18/2017  . Memory changes 11/19/2017  . Type 2 diabetes mellitus with hyperglycemia, without long-term current use of insulin ([place]) 06/25/2019  . Low serum potassium 06/26/2019  . Gastroesophageal reflux disease 06/26/2019  . History of COVID-19 09/23/2019   Resolved Ambulatory Problems    Diagnosis Date Noted  . ANXIETY 02/28/2010  . Class 1 obesity due to excess calories with serious comorbidity and body mass index (BMI) of 33.0 to 33.9 in adult 11/04/2014  . BMI 34.0-34.9,adult 11/04/2014  . Abnormal weight loss 05/06/2015   Past Medical History:  Diagnosis Date  . Anxiety      Review of Systems  All other systems reviewed and  are negative.      Objective:   Physical Exam Vitals reviewed.  Constitutional:      Appearance: Normal appearance. She is obese.  HENT:     Head: Normocephalic.  Cardiovascular:     Rate and Rhythm: Normal rate and regular rhythm.     Heart sounds: Murmur present.  Pulmonary:     Effort: Pulmonary effort is normal.  Neurological:     General: No focal deficit present.     Mental Status: She is alert and oriented to person, place, and time.  Psychiatric:        Mood and Affect: Mood normal.           Assessment & Plan:  Marland KitchenMarland KitchenShequilla was seen today for diabetes.  Diagnoses and all orders for this visit:  Type 2 diabetes mellitus with hyperglycemia, without long-term current use of insulin (HCC) -     POCT glycosylated hemoglobin (Hb A1C) -     EKG 12-Lead -     Semaglutide,0.25 or 0.5MG /DOS, (OZEMPIC, 0.25 OR 0.5 MG/DOSE,) 2 MG/1.5ML SOPN; Inject 0.5 mg into the skin once a week.  Low serum potassium -     COMPLETE METABOLIC PANEL WITH GFR -     EKG 12-Lead  Essential hypertension, benign -     EKG 12-Lead -     olmesartan-hydrochlorothiazide (BENICAR HCT) 40-25 MG tablet; Take 1 tablet by mouth daily.  Stress  History of COVID-19   .Marland Kitchen Results for orders placed or  performed in visit on 09/20/19  POCT glycosylated hemoglobin (Hb A1C)  Result Value Ref Range   Hemoglobin A1C 6.0 (A) 4.0 - 5.6 %   HbA1c POC (<> result, manual entry)     HbA1c, POC (prediabetic range)     HbA1c, POC (controlled diabetic range)     A1C much better.  Continue on ozempic.  Continue to work on diet and weight loss.  On STATIN. Not rechecked since LDL was 158.  On ARB. BP to goal.  UTD eye exam.  Flu and pneumonia shot UTD.  Follow up in 3 months.   Doing well with death of father. Coping skills are great.   EKG for management and screening over 50. NSR at 71. No acute ST elevation or depression. No arrhthymias.    Follow up in 3 months.

## 2019-09-23 ENCOUNTER — Encounter: Payer: Self-pay | Admitting: Physician Assistant

## 2019-09-23 DIAGNOSIS — Z8616 Personal history of COVID-19: Secondary | ICD-10-CM | POA: Insufficient documentation

## 2019-09-27 MED FILL — PANTOPRAZOLE SOD DR 40 MG T: 40 | 90 days supply | Qty: 90 | Fill #1

## 2019-09-27 MED FILL — SERTRALINE HCL 50 MG TABLET: 50 | 90 days supply | Qty: 135 | Fill #1

## 2019-09-27 MED FILL — ATORVASTATIN 40 MG TABLET: 40 | 90 days supply | Qty: 90 | Fill #1

## 2019-10-09 NOTE — Telephone Encounter (Signed)
Error

## 2019-12-19 MED FILL — OZEMPIC 0.25 OR 0.5 MG/DOSE: 2 | 84 days supply | Qty: 5 | Fill #1

## 2020-03-09 ENCOUNTER — Other Ambulatory Visit: Payer: Self-pay | Admitting: Physician Assistant

## 2020-03-09 DIAGNOSIS — E1165 Type 2 diabetes mellitus with hyperglycemia: Secondary | ICD-10-CM

## 2020-03-09 MED FILL — OZEMPIC 0.25 OR 0.5 MG/DOSE: 2 | 28 days supply | Qty: 2 | Fill #0

## 2020-04-14 ENCOUNTER — Ambulatory Visit: Payer: No Typology Code available for payment source | Admitting: Physician Assistant

## 2020-04-15 MED FILL — ATORVASTATIN CALCIUM 40 MG: 40 | 90 days supply | Qty: 90 | Fill #3

## 2020-04-15 MED FILL — OLMESARTAN-HCTZ 40-25 MG TA: 40-25 | 90 days supply | Qty: 90 | Fill #2

## 2020-04-17 ENCOUNTER — Telehealth: Payer: Self-pay | Admitting: *Deleted

## 2020-04-17 ENCOUNTER — Other Ambulatory Visit (HOSPITAL_BASED_OUTPATIENT_CLINIC_OR_DEPARTMENT_OTHER): Payer: Self-pay | Admitting: Physician Assistant

## 2020-04-17 ENCOUNTER — Encounter: Payer: Self-pay | Admitting: Physician Assistant

## 2020-04-17 ENCOUNTER — Ambulatory Visit (INDEPENDENT_AMBULATORY_CARE_PROVIDER_SITE_OTHER): Payer: No Typology Code available for payment source | Admitting: Physician Assistant

## 2020-04-17 ENCOUNTER — Other Ambulatory Visit: Payer: Self-pay

## 2020-04-17 VITALS — BP 116/73 | HR 89 | Ht 60.0 in | Wt 181.0 lb

## 2020-04-17 DIAGNOSIS — Z6835 Body mass index (BMI) 35.0-35.9, adult: Secondary | ICD-10-CM | POA: Diagnosis not present

## 2020-04-17 DIAGNOSIS — E1165 Type 2 diabetes mellitus with hyperglycemia: Secondary | ICD-10-CM | POA: Diagnosis not present

## 2020-04-17 DIAGNOSIS — F411 Generalized anxiety disorder: Secondary | ICD-10-CM | POA: Diagnosis not present

## 2020-04-17 DIAGNOSIS — E6609 Other obesity due to excess calories: Secondary | ICD-10-CM

## 2020-04-17 LAB — POCT GLYCOSYLATED HEMOGLOBIN (HGB A1C): Hemoglobin A1C: 5.6 % (ref 4.0–5.6)

## 2020-04-17 MED ORDER — OZEMPIC (0.25 OR 0.5 MG/DOSE) 2 MG/1.5ML ~~LOC~~ SOPN: 1.0000 mg | PEN_INJECTOR | SUBCUTANEOUS | 1 refills | Status: DC

## 2020-04-17 MED ORDER — SERTRALINE HCL 50 MG PO TABS: ORAL_TABLET | ORAL | 1 refills | Status: DC

## 2020-04-17 MED ORDER — OZEMPIC (1 MG/DOSE) 2 MG/1.5ML ~~LOC~~ SOPN: 1.0000 mg | PEN_INJECTOR | SUBCUTANEOUS | 1 refills | Status: DC

## 2020-04-17 MED FILL — OZEMPIC (1 MG/DOSE) 4 MG/3M: 4 | 84 days supply | Qty: 9 | Fill #0

## 2020-04-17 MED FILL — SERTRALINE HCL 50 MG TABLET: 50 | 90 days supply | Qty: 135 | Fill #0

## 2020-04-17 NOTE — Telephone Encounter (Signed)
Sent new rx

## 2020-04-17 NOTE — Telephone Encounter (Signed)
[facility] called and said they need a new rx for the 1mg  pen of Ozempic since it doesn't come in .75mg .

## 2020-04-17 NOTE — Progress Notes (Signed)
Subjective:    Patient ID: Kristen Bright, female    DOB: 1965-10-25, 54 y.o.   MRN: [id number]  HPI  Pt is a 54 yo female with HTN, T2DM who presents to the clinic for 3 month follow up.   Pt is not checking her sugars. She is taking ozempic. She is watching sugars and carbs.she is not as active as she would like. No hypoglycemia, no open sores or wounds.   She needs referral for eye exam due to insurance plan.    .. Active Ambulatory Problems    Diagnosis Date Noted  . 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 ELEVATED BLOOD PRESSURE WITHOUT DIAGNOSIS OF HYPERTENSION 11/09/2010  . IFG (impaired fasting glucose) 05/28/2013  . OSA (obstructive sleep apnea) 09/30/2013  . Anemia, iron deficiency 10/02/2013  . Low iron stores 10/02/2013  . Heart murmur 06/24/2014  . Fatty liver disease, nonalcoholic 35/46/5681  . Class 1 obesity due to excess calories with serious comorbidity and body mass index (BMI) of 32.0 to 32.9 in adult 11/04/2014  . Abnormal weight gain 11/04/2014  . CN (constipation) 05/06/2015  . Stress 09/14/2015  . GAD (generalized anxiety disorder) 09/14/2015  . Essential hypertension, benign 09/14/2015  . DDD (degenerative disc disease), cervical 08/18/2016  . Mixed hyperlipidemia 01/18/2017  . Memory changes 11/19/2017  . Type 2 diabetes mellitus with hyperglycemia, without long-term current use of insulin ([place]) 06/25/2019  . Low serum potassium 06/26/2019  . Gastroesophageal reflux disease 06/26/2019  . History of COVID-19 09/23/2019   Resolved Ambulatory Problems    Diagnosis Date Noted  . ANXIETY 02/28/2010  . BMI 34.0-34.9,adult 11/04/2014  . Abnormal weight loss 05/06/2015   Past Medical History:  Diagnosis Date  . Anxiety     Review of Systems  All other systems reviewed and are negative.      Objective:   Physical Exam Vitals reviewed.  Constitutional:      Appearance: Normal appearance. She is obese.  HENT:     Head: Normocephalic.  Cardiovascular:      Rate and Rhythm: Normal rate and regular rhythm.     Pulses: Normal pulses.     Heart sounds: Normal heart sounds.  Pulmonary:     Effort: Pulmonary effort is normal.     Breath sounds: Normal breath sounds.  Neurological:     General: No focal deficit present.     Mental Status: She is alert and oriented to person, place, and time.  Psychiatric:        Mood and Affect: Mood normal.          .. Depression screen [facility] 2/9 04/17/2020 06/25/2019 11/14/2017 01/17/2017  Decreased Interest 0 0 0 0  Down, Depressed, Hopeless 0 0 0 0  PHQ - 2 Score 0 0 0 0  Altered sleeping - 0 0 -  Tired, decreased energy - 0 0 -  Change in appetite - 0 0 -  Feeling bad or failure about yourself  - 0 0 -  Trouble concentrating - 0 1 -  Moving slowly or fidgety/restless - 0 0 -  Suicidal thoughts - 0 0 -  PHQ-9 Score - 0 1 -  Difficult doing work/chores - Not difficult at all Not difficult at all -    Assessment & Plan:   Marland KitchenMarland KitchenDiagnoses and all orders for this visit:  Class 2 obesity due to excess calories without serious comorbidity with body mass index (BMI) of 35.0 to 35.9 in adult  GAD (generalized anxiety disorder) -  sertraline (ZOLOFT) 50 MG tablet; TAKE 1 & 1/2 TABLETS (75 MG TOTAL) BY MOUTH DAILY.  Type 2 diabetes mellitus with hyperglycemia, without long-term current use of insulin (HCC) -     Discontinue: Semaglutide,0.25 or 0.5MG /DOS, (OZEMPIC, 0.25 OR 0.5 MG/DOSE,) 2 MG/1.5ML SOPN; Inject 0.75 mLs (1 mg total) into the skin once a week. -     POCT HgB A1C -     Ambulatory referral to Ophthalmology    Lab Results  Component Value Date   HGBA1C 5.6 04/17/2020   a1C great.  Increased ozempic to 1mg  for weight loss and glucose control.  On ARB. BP great.  On STATin.  Needs DM eye exam.  Vaccines UTD.   PHQ great.  Refilled zoloft.   Marland Kitchen.Discussed low carb diet with 1500 calories and 80g of protein.  Exercising at least 150 minutes a week.  [employer] could be a  Microbiologist.    Follow up in 3 months.

## 2020-05-11 ENCOUNTER — Other Ambulatory Visit: Payer: Self-pay | Admitting: Nurse Practitioner

## 2020-07-09 ENCOUNTER — Other Ambulatory Visit: Payer: Self-pay | Admitting: Physician Assistant

## 2020-07-09 DIAGNOSIS — E782 Mixed hyperlipidemia: Secondary | ICD-10-CM

## 2020-07-09 MED FILL — OZEMPIC (1 MG/DOSE) 4 MG/3M: 4 | 84 days supply | Qty: 9 | Fill #1

## 2020-07-09 MED FILL — OLMESARTAN-HCTZ 40-25 MG TA: 40-25 | 90 days supply | Qty: 90 | Fill #3

## 2020-07-10 ENCOUNTER — Other Ambulatory Visit: Payer: Self-pay | Admitting: Physician Assistant

## 2020-07-10 MED FILL — ATORVASTATIN CALCIUM 40 MG: 40 | 90 days supply | Qty: 90 | Fill #0

## 2020-07-28 ENCOUNTER — Ambulatory Visit: Payer: No Typology Code available for payment source | Attending: Internal Medicine

## 2020-07-28 ENCOUNTER — Other Ambulatory Visit (HOSPITAL_BASED_OUTPATIENT_CLINIC_OR_DEPARTMENT_OTHER): Payer: Self-pay | Admitting: Internal Medicine

## 2020-07-28 DIAGNOSIS — Z23 Encounter for immunization: Secondary | ICD-10-CM

## 2020-07-28 MED FILL — PFIZER-BIONTECH COVID-19 VA: 30 | 1 days supply | Qty: 0 | Fill #0

## 2020-07-28 NOTE — Progress Notes (Signed)
   Covid-19 Vaccination Clinic  Name:  Kristen Bright    MRN: [id number] DOB: 1965-09-04  07/28/2020  [name] was observed post Covid-19 immunization for 15 minutes without incident. She was provided with Vaccine Information Sheet and instruction to access the V-Safe system.   [name] was instructed to call 911 with any severe reactions post vaccine: Marland Kitchen Difficulty breathing  . Swelling of face and throat  . A fast heartbeat  . A bad rash all over body  . Dizziness and weakness   Immunizations Administered    Name Date Dose VIS Date Route   Pfizer COVID-19 Vaccine 07/28/2020 12:58 PM 0.3 mL 06/17/2020 Intramuscular   Manufacturer: [place]   Lot: [id number]   [place]: [id number]

## 2020-10-13 ENCOUNTER — Other Ambulatory Visit: Payer: Self-pay

## 2020-10-13 ENCOUNTER — Ambulatory Visit (INDEPENDENT_AMBULATORY_CARE_PROVIDER_SITE_OTHER): Payer: No Typology Code available for payment source | Admitting: Physician Assistant

## 2020-10-13 ENCOUNTER — Encounter: Payer: Self-pay | Admitting: Physician Assistant

## 2020-10-13 ENCOUNTER — Other Ambulatory Visit: Payer: Self-pay | Admitting: Physician Assistant

## 2020-10-13 VITALS — BP 118/65 | HR 85 | Wt 177.9 lb

## 2020-10-13 DIAGNOSIS — E782 Mixed hyperlipidemia: Secondary | ICD-10-CM

## 2020-10-13 DIAGNOSIS — K219 Gastro-esophageal reflux disease without esophagitis: Secondary | ICD-10-CM

## 2020-10-13 DIAGNOSIS — M5030 Other cervical disc degeneration, unspecified cervical region: Secondary | ICD-10-CM

## 2020-10-13 DIAGNOSIS — Z6832 Body mass index (BMI) 32.0-32.9, adult: Secondary | ICD-10-CM

## 2020-10-13 DIAGNOSIS — Z1329 Encounter for screening for other suspected endocrine disorder: Secondary | ICD-10-CM

## 2020-10-13 DIAGNOSIS — E6609 Other obesity due to excess calories: Secondary | ICD-10-CM

## 2020-10-13 DIAGNOSIS — E1165 Type 2 diabetes mellitus with hyperglycemia: Secondary | ICD-10-CM

## 2020-10-13 DIAGNOSIS — D509 Iron deficiency anemia, unspecified: Secondary | ICD-10-CM | POA: Diagnosis not present

## 2020-10-13 DIAGNOSIS — F411 Generalized anxiety disorder: Secondary | ICD-10-CM

## 2020-10-13 DIAGNOSIS — I10 Essential (primary) hypertension: Secondary | ICD-10-CM | POA: Diagnosis not present

## 2020-10-13 LAB — POCT GLYCOSYLATED HEMOGLOBIN (HGB A1C): Hemoglobin A1C: 6 % — AB (ref 4.0–5.6)

## 2020-10-13 MED ORDER — NAPROXEN 500 MG PO TABS: 500.0000 mg | ORAL_TABLET | Freq: Two times a day (BID) | ORAL | 2 refills | Status: DC

## 2020-10-13 MED ORDER — OZEMPIC (1 MG/DOSE) 2 MG/1.5ML ~~LOC~~ SOPN: 1.0000 mg | PEN_INJECTOR | SUBCUTANEOUS | 1 refills | Status: DC

## 2020-10-13 MED ORDER — ATORVASTATIN CALCIUM 40 MG PO TABS: 40.0000 mg | ORAL_TABLET | Freq: Every day | ORAL | 0 refills | Status: DC

## 2020-10-13 MED ORDER — PANTOPRAZOLE SODIUM 40 MG PO TBEC: 40.0000 mg | DELAYED_RELEASE_TABLET | Freq: Every day | ORAL | 3 refills | Status: DC

## 2020-10-13 MED ORDER — CYCLOBENZAPRINE HCL 10 MG PO TABS
ORAL_TABLET | ORAL | 0 refills | Status: DC
Start: 2020-10-13 — End: 2020-10-13

## 2020-10-13 MED ORDER — OLMESARTAN MEDOXOMIL-HCTZ 40-25 MG PO TABS: 1.0000 | ORAL_TABLET | Freq: Every day | ORAL | 3 refills | Status: DC

## 2020-10-13 MED FILL — PANTOPRAZOLE SOD DR 40 MG T: 40 | 90 days supply | Qty: 90 | Fill #0

## 2020-10-13 MED FILL — OLMESARTAN-HCTZ 40-25 MG TA: 40-25 | 90 days supply | Qty: 90 | Fill #0

## 2020-10-13 MED FILL — OZEMPIC (1 MG/DOSE) 4 MG/3M: 4 | 84 days supply | Qty: 9 | Fill #0

## 2020-10-13 MED FILL — NAPROXEN 500 MG TABS: 500 | 30 days supply | Qty: 60 | Fill #0

## 2020-10-13 MED FILL — CYCLOBENZAPRINE HCL 10 MG T: 10 | 20 days supply | Qty: 30 | Fill #0

## 2020-10-13 MED FILL — ATORVASTATIN CALCIUM 40 MG: 40 | 90 days supply | Qty: 90 | Fill #0

## 2020-10-13 NOTE — Progress Notes (Signed)
Subjective:    Patient ID: Kristen Bright, female    DOB: Oct 11, 1965, 55 y.o.   MRN: [id number]  HPI  Pt is a 55 yo obese female with T2DM, HTN, HLD, GERD, anemia, GAD who presents to the clinic for 3 month follow up.   Pt is doing well. She has lost about 5lbs but not happy with weight loss thus far. She denies hypoglycemic events. She has been very compliant with her medications. She denies any open sores or wounds. She is trying to watch her diet but has not been doing as well as she likes. She is not checking her sugars regularly.  Patient denies any chest pain, palpitations, headache, vision changes.  GERD is controlled with Protonix. She needs refill.  Anxiety is well controlled on Zoloft. No concerns or complaints.  .. Active Ambulatory Problems    Diagnosis Date Noted  . 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 ELEVATED BLOOD PRESSURE WITHOUT DIAGNOSIS OF HYPERTENSION 11/09/2010  . IFG (impaired fasting glucose) 05/28/2013  . OSA (obstructive sleep apnea) 09/30/2013  . Anemia, iron deficiency 10/02/2013  . Low iron stores 10/02/2013  . Heart murmur 06/24/2014  . Fatty liver disease, nonalcoholic 62/70/3500  . Class 1 obesity due to excess calories with serious comorbidity and body mass index (BMI) of 32.0 to 32.9 in adult 11/04/2014  . Abnormal weight gain 11/04/2014  . CN (constipation) 05/06/2015  . Stress 09/14/2015  . GAD (generalized anxiety disorder) 09/14/2015  . Essential hypertension, benign 09/14/2015  . DDD (degenerative disc disease), cervical 08/18/2016  . Mixed hyperlipidemia 01/18/2017  . Memory changes 11/19/2017  . Type 2 diabetes mellitus with hyperglycemia, without long-term current use of insulin ([place]) 06/25/2019  . Low serum potassium 06/26/2019  . Gastroesophageal reflux disease 06/26/2019  . History of COVID-19 09/23/2019   Resolved Ambulatory Problems    Diagnosis Date Noted  . ANXIETY 02/28/2010  . BMI 34.0-34.9,adult 11/04/2014  . Abnormal weight  loss 05/06/2015   Past Medical History:  Diagnosis Date  . Anxiety        Review of Systems  All other systems reviewed and are negative.      Objective:   Physical Exam Vitals reviewed.  Constitutional:      Appearance: Normal appearance. She is obese.  Neck:     Vascular: No carotid bruit.  Cardiovascular:     Rate and Rhythm: Normal rate and regular rhythm.     Pulses: Normal pulses.     Heart sounds: Murmur heard.    Pulmonary:     Effort: Pulmonary effort is normal.     Breath sounds: Normal breath sounds.  Musculoskeletal:     Right lower leg: No edema.     Left lower leg: No edema.  Neurological:     General: No focal deficit present.     Mental Status: She is alert and oriented to person, place, and time.  Psychiatric:        Mood and Affect: Mood normal.          .. Results for orders placed or performed in visit on 10/13/20  POCT glycosylated hemoglobin (Hb A1C)  Result Value Ref Range   Hemoglobin A1C 6.0 (A) 4.0 - 5.6 %   HbA1c POC (<> result, manual entry)     HbA1c, POC (prediabetic range)     HbA1c, POC (controlled diabetic range)    .Marland Kitchen Depression screen [facility] 2/9 04/17/2020 06/25/2019 11/14/2017 01/17/2017  Decreased Interest 0 0 0 0  Down, Depressed,  Hopeless 0 0 0 0  PHQ - 2 Score 0 0 0 0  Altered sleeping - 0 0 -  Tired, decreased energy - 0 0 -  Change in appetite - 0 0 -  Feeling bad or failure about yourself  - 0 0 -  Trouble concentrating - 0 1 -  Moving slowly or fidgety/restless - 0 0 -  Suicidal thoughts - 0 0 -  PHQ-9 Score - 0 1 -  Difficult doing work/chores - Not difficult at all Not difficult at all -     Assessment & Plan:  Marland KitchenMarland KitchenDiagnoses and all orders for this visit:  Type 2 diabetes mellitus with hyperglycemia, without long-term current use of insulin (HCC) -     COMPLETE METABOLIC PANEL WITH GFR -     POCT glycosylated hemoglobin (Hb A1C) -     Semaglutide, 1 MG/DOSE, (OZEMPIC, 1 MG/DOSE,) 2 MG/1.5ML SOPN; Inject  1 mg into the skin once a week.  Essential hypertension, benign -     COMPLETE METABOLIC PANEL WITH GFR -     olmesartan-hydrochlorothiazide (BENICAR HCT) 40-25 MG tablet; Take 1 tablet by mouth daily.  Mixed hyperlipidemia -     Lipid Panel w/reflex Direct LDL -     atorvastatin (LIPITOR) 40 MG tablet; Take 1 tablet (40 mg total) by mouth daily.  Iron deficiency anemia, unspecified iron deficiency anemia type -     CBC with Differential/Platelet  Gastroesophageal reflux disease, unspecified whether esophagitis present -     pantoprazole (PROTONIX) 40 MG tablet; Take 1 tablet (40 mg total) by mouth daily.  DDD (degenerative disc disease), cervical -     cyclobenzaprine (FLEXERIL) 10 MG tablet; One tab PO qHS, then increase gradually to one tab TID. -     naproxen (NAPROSYN) 500 MG tablet; Take 1 tablet (500 mg total) by mouth 2 (two) times daily with a meal.  Thyroid disorder screen -     TSH  Class 1 obesity due to excess calories with serious comorbidity and body mass index (BMI) of 32.0 to 32.9 in adult -     Semaglutide, 1 MG/DOSE, (OZEMPIC, 1 MG/DOSE,) 2 MG/1.5ML SOPN; Inject 1 mg into the skin once a week.   A1C to goal.  Continue same medications.  Increased ozempic for weight loss benefits.  BP to goal. On ARB. On statin.  Foot exam next visit.  Needs pneumonia and Tdap would like to wait until CPE in March.  Labs ordered for that visit.   Follow up in 3 months.

## 2020-10-16 ENCOUNTER — Encounter: Payer: Self-pay | Admitting: Physician Assistant

## 2020-10-16 MED ORDER — SERTRALINE HCL 50 MG PO TABS
ORAL_TABLET | ORAL | 1 refills | Status: DC
Start: 2020-10-16 — End: 2021-10-05

## 2020-11-10 LAB — HM DIABETES EYE EXAM

## 2020-11-12 ENCOUNTER — Encounter: Payer: Self-pay | Admitting: Physician Assistant

## 2020-11-24 ENCOUNTER — Encounter: Payer: No Typology Code available for payment source | Admitting: Physician Assistant

## 2020-12-29 ENCOUNTER — Other Ambulatory Visit (HOSPITAL_BASED_OUTPATIENT_CLINIC_OR_DEPARTMENT_OTHER): Payer: Self-pay

## 2020-12-29 MED FILL — Pantoprazole Sodium EC Tab 40 MG (Base Equiv): ORAL | 90 days supply | Qty: 90 | Fill #0 | Status: AC

## 2020-12-30 ENCOUNTER — Other Ambulatory Visit (HOSPITAL_BASED_OUTPATIENT_CLINIC_OR_DEPARTMENT_OTHER): Payer: Self-pay

## 2021-01-12 ENCOUNTER — Other Ambulatory Visit (HOSPITAL_BASED_OUTPATIENT_CLINIC_OR_DEPARTMENT_OTHER): Payer: Self-pay

## 2021-01-12 MED FILL — Semaglutide Soln Pen-inj 1 MG/DOSE (4 MG/3ML): SUBCUTANEOUS | 84 days supply | Qty: 9 | Fill #0 | Status: AC

## 2021-02-09 ENCOUNTER — Ambulatory Visit: Payer: No Typology Code available for payment source | Admitting: Physician Assistant

## 2021-02-16 ENCOUNTER — Other Ambulatory Visit: Payer: Self-pay

## 2021-02-16 ENCOUNTER — Other Ambulatory Visit: Payer: Self-pay | Admitting: Physician Assistant

## 2021-02-16 ENCOUNTER — Encounter: Payer: Self-pay | Admitting: Physician Assistant

## 2021-02-16 ENCOUNTER — Ambulatory Visit (INDEPENDENT_AMBULATORY_CARE_PROVIDER_SITE_OTHER): Payer: No Typology Code available for payment source | Admitting: Physician Assistant

## 2021-02-16 VITALS — BP 121/58 | HR 70 | Ht 60.0 in | Wt 170.0 lb

## 2021-02-16 DIAGNOSIS — E6609 Other obesity due to excess calories: Secondary | ICD-10-CM

## 2021-02-16 DIAGNOSIS — E1165 Type 2 diabetes mellitus with hyperglycemia: Secondary | ICD-10-CM

## 2021-02-16 DIAGNOSIS — R6889 Other general symptoms and signs: Secondary | ICD-10-CM

## 2021-02-16 DIAGNOSIS — Z23 Encounter for immunization: Secondary | ICD-10-CM

## 2021-02-16 DIAGNOSIS — Z6833 Body mass index (BMI) 33.0-33.9, adult: Secondary | ICD-10-CM

## 2021-02-16 DIAGNOSIS — R059 Cough, unspecified: Secondary | ICD-10-CM | POA: Diagnosis not present

## 2021-02-16 DIAGNOSIS — Z0000 Encounter for general adult medical examination without abnormal findings: Secondary | ICD-10-CM

## 2021-02-16 DIAGNOSIS — E782 Mixed hyperlipidemia: Secondary | ICD-10-CM

## 2021-02-16 LAB — POCT GLYCOSYLATED HEMOGLOBIN (HGB A1C): Hemoglobin A1C: 5.7 % — AB (ref 4.0–5.6)

## 2021-02-16 MED ORDER — ATORVASTATIN CALCIUM 40 MG PO TABS: ORAL_TABLET | Freq: Every day | ORAL | 3 refills | Status: DC

## 2021-02-16 MED ORDER — SEMAGLUTIDE (1 MG/DOSE) 4 MG/3ML ~~LOC~~ SOPN: 2.0000 mg | PEN_INJECTOR | SUBCUTANEOUS | 1 refills | Status: DC

## 2021-02-16 NOTE — Progress Notes (Signed)
Emelee,   Kidney and liver function look great.  Potassium normal range.  Hemoglobin looks good.  Thyroid normal.  LDL, bad cholesterol, looks amazing.  HDL, good cholesterol, looks great.  TG are up but likely because sugars are up. JJ, please add A!C.

## 2021-02-16 NOTE — Progress Notes (Signed)
Subjective:     Kristen Bright is a 55 y.o. female and is here for a comprehensive physical exam. The patient reports problems - having some throat congestion and sputum after eating. She eats then has to cough up stuff.  .  Social History   Socioeconomic History   Marital status: Married    Spouse name: Ronalee Belts   Number of children: Not on file   Years of education: Not on file   Highest education level: Not on file  Occupational History    Employer: [email or web address]    Comment: [place]  Tobacco Use   Smoking status: Former    Pack years: 0.00    Types: Cigarettes   Smokeless tobacco: Never   Tobacco comments:    Says smoked socially for about 3 years when younger  Substance and Sexual Activity   Alcohol use: Yes    Comment: rarely   Drug use: Yes   Sexual activity: Yes    Partners: Male  Other Topics Concern   Not on file  Social History Narrative   Some exercise.     Social Determinants of Health   Financial Resource Strain: Not on file  Food Insecurity: Not on file  Transportation Needs: Not on file  Physical Activity: Not on file  Stress: Not on file  Social Connections: Not on file  Intimate Partner Violence: Not on file   Health Maintenance  Topic Date Due   COVID-19 Vaccine (4 - Booster for [place] series) 03/04/2021 (Originally 11/25/2020)   PAP SMEAR-Modifier  04/29/2021 (Originally 05/06/2017)   Zoster Vaccines- Shingrix (1 of 2) 05/19/2021 (Originally 02/03/2017)   PNEUMOCOCCAL POLYSACCHARIDE VACCINE AGE 34-64 HIGH RISK  02/16/2022 (Originally 02/03/1969)   HIV Screening  02/16/2022 (Originally 02/03/1982)   INFLUENZA VACCINE  03/29/2021   MAMMOGRAM  06/19/2021   HEMOGLOBIN A1C  08/18/2021   OPHTHALMOLOGY EXAM  11/10/2021   COLONOSCOPY (Pts 45-36yrs Insurance coverage will need to be confirmed)  07/02/2028   TETANUS/TDAP  02/17/2031   Hepatitis C Screening  Completed   Pneumococcal Vaccine 6-97 Years old  Aged Out   HPV VACCINES  Aged Out   FOOT  EXAM  Discontinued    The following portions of the patient's history were reviewed and updated as appropriate: allergies, current medications, past family history, past medical history, past social history, past surgical history, and problem list.  Review of Systems A comprehensive review of systems was negative.   Objective:    BP (!) 121/58   Pulse 70   Ht 5' (1.524 m)   Wt 170 lb (77.1 kg)   SpO2 100%   BMI 33.20 kg/m  General appearance: alert, cooperative, appears stated age, and moderately obese Head: Normocephalic, without obvious abnormality, atraumatic Eyes: conjunctivae/corneas clear. PERRL, EOM's intact. Fundi benign. Ears: normal TM's and external ear canals both ears Nose: Nares normal. Septum midline. Mucosa normal. No drainage or sinus tenderness. Throat: lips, mucosa, and tongue normal; teeth and gums normal Neck: no adenopathy, no carotid bruit, no JVD, supple, symmetrical, trachea midline, and thyroid not enlarged, symmetric, no tenderness/mass/nodules Back: symmetric, no curvature. ROM normal. No CVA tenderness. Lungs: clear to auscultation bilaterally Heart: regular rate and rhythm, S1, S2 normal, no murmur, click, rub or gallop Abdomen: soft, non-tender; bowel sounds normal; no masses,  no organomegaly Extremities: extremities normal, atraumatic, no cyanosis or edema Pulses: 2+ and symmetric Skin: Skin color, texture, turgor normal. No rashes or lesions Lymph nodes: Cervical, supraclavicular, and axillary nodes normal. Neurologic: Alert  and oriented X 3, normal strength and tone. Normal symmetric reflexes. Normal coordination and gait   .Marland Kitchen Depression screen [facility] 2/9 02/16/2021 04/17/2020 06/25/2019 11/14/2017 01/17/2017  Decreased Interest 0 0 0 0 0  Down, Depressed, Hopeless 0 0 0 0 0  PHQ - 2 Score 0 0 0 0 0  Altered sleeping - - 0 0 -  Tired, decreased energy - - 0 0 -  Change in appetite - - 0 0 -  Feeling bad or failure about yourself  - - 0 0 -   Trouble concentrating - - 0 1 -  Moving slowly or fidgety/restless - - 0 0 -  Suicidal thoughts - - 0 0 -  PHQ-9 Score - - 0 1 -  Difficult doing work/chores - - Not difficult at all Not difficult at all -  .. Lab Results  Component Value Date   HGBA1C 5.7 (A) 02/16/2021     Assessment:    Healthy female exam.      Plan:    Marland KitchenMarland KitchenKatriana was seen today for annual exam.  Diagnoses and all orders for this visit:  Routine physical examination  Type 2 diabetes mellitus with hyperglycemia, without long-term current use of insulin (HCC) -     POCT glycosylated hemoglobin (Hb A1C)  Cough -     Allergen food profile specific IgE -     Allergens (22) Foods  Need for Tdap vaccination -     Tdap vaccine greater than or equal to 7yo IM  Throat congestion -     Allergen food profile specific IgE -     Allergens (22) Foods  Class 1 obesity due to excess calories with serious comorbidity and body mass index (BMI) of 33.0 to 33.9 in adult  .Marland Kitchen Discussed 150 minutes of exercise a week.  Encouraged vitamin D 1000 units and Calcium 1300mg  or 4 servings of dairy a day.  Fasting labs reviewed. Elevated fasting glucose and TG. Discussed diet and weight loss.  PHQ with no concerns.  Colonoscopy ordered.  Mammogram she will schedule.  Pap scheduled for august.  Will get shingrix after she gets done with vacations.  Tdap given today.  Covid vaccines UTD.  Pneumonia vaccine UTD.   A1c looks great.  Stay on same medications.  BP to goal.  On statin.   Throat congestion. Check allergies always after eating. Stay on protonix.   See After Visit Summary for Counseling Recommendations

## 2021-02-16 NOTE — Patient Instructions (Signed)
Health Maintenance, Female Adopting a healthy lifestyle and getting preventive care are important in promoting health and wellness. Ask your health care provider about: The right schedule for you to have regular tests and exams. Things you can do on your own to prevent diseases and keep yourself healthy. What should I know about diet, weight, and exercise? Eat a healthy diet  Eat a diet that includes plenty of vegetables, fruits, low-fat dairy products, and lean protein. Do not eat a lot of foods that are high in solid fats, added sugars, or sodium.  Maintain a healthy weight Body mass index (BMI) is used to identify weight problems. It estimates body fat based on height and weight. Your health care provider can help determineyour BMI and help you achieve or maintain a healthy weight. Get regular exercise Get regular exercise. This is one of the most important things you can do for your health. Most adults should: Exercise for at least 150 minutes each week. The exercise should increase your heart rate and make you sweat (moderate-intensity exercise). Do strengthening exercises at least twice a week. This is in addition to the moderate-intensity exercise. Spend less time sitting. Even light physical activity can be beneficial. Watch cholesterol and blood lipids Have your blood tested for lipids and cholesterol at 54 years of age, then havethis test every 5 years. Have your cholesterol levels checked more often if: Your lipid or cholesterol levels are high. You are older than 54 years of age. You are at high risk for heart disease. What should I know about cancer screening? Depending on your health history and family history, you may need to have cancer screening at various ages. This may include screening for: Breast cancer. Cervical cancer. Colorectal cancer. Skin cancer. Lung cancer. What should I know about heart disease, diabetes, and high blood pressure? Blood pressure and heart  disease High blood pressure causes heart disease and increases the risk of stroke. This is more likely to develop in people who have high blood pressure readings, are of African descent, or are overweight. Have your blood pressure checked: Every 3-5 years if you are 18-39 years of age. Every year if you are 40 years old or older. Diabetes Have regular diabetes screenings. This checks your fasting blood sugar level. Have the screening done: Once every three years after age 40 if you are at a normal weight and have a low risk for diabetes. More often and at a younger age if you are overweight or have a high risk for diabetes. What should I know about preventing infection? Hepatitis B If you have a higher risk for hepatitis B, you should be screened for this virus. Talk with your health care provider to find out if you are at risk forhepatitis B infection. Hepatitis C Testing is recommended for: Everyone born from 1945 through 1965. Anyone with known risk factors for hepatitis C. Sexually transmitted infections (STIs) Get screened for STIs, including gonorrhea and chlamydia, if: You are sexually active and are younger than 54 years of age. You are older than 54 years of age and your health care provider tells you that you are at risk for this type of infection. Your sexual activity has changed since you were last screened, and you are at increased risk for chlamydia or gonorrhea. Ask your health care provider if you are at risk. Ask your health care provider about whether you are at high risk for HIV. Your health care provider may recommend a prescription medicine to help   prevent HIV infection. If you choose to take medicine to prevent HIV, you should first get tested for HIV. You should then be tested every 3 months for as long as you are taking the medicine. Pregnancy If you are about to stop having your period (premenopausal) and you may become pregnant, seek counseling before you get  pregnant. Take 400 to 800 micrograms (mcg) of folic acid every day if you become pregnant. Ask for birth control (contraception) if you want to prevent pregnancy. Osteoporosis and menopause Osteoporosis is a disease in which the bones lose minerals and strength with aging. This can result in bone fractures. If you are 65 years old or older, or if you are at risk for osteoporosis and fractures, ask your health care provider if you should: Be screened for bone loss. Take a calcium or vitamin D supplement to lower your risk of fractures. Be given hormone replacement therapy (HRT) to treat symptoms of menopause. Follow these instructions at home: Lifestyle Do not use any products that contain nicotine or tobacco, such as cigarettes, e-cigarettes, and chewing tobacco. If you need help quitting, ask your health care provider. Do not use street drugs. Do not share needles. Ask your health care provider for help if you need support or information about quitting drugs. Alcohol use Do not drink alcohol if: Your health care provider tells you not to drink. You are pregnant, may be pregnant, or are planning to become pregnant. If you drink alcohol: Limit how much you use to 0-1 drink a day. Limit intake if you are breastfeeding. Be aware of how much alcohol is in your drink. In the [place] one drink equals one 12 oz bottle of beer (355 mL), one 5 oz glass of wine (148 mL), or one 1 oz glass of hard liquor (44 mL). General instructions Schedule regular health, dental, and eye exams. Stay current with your vaccines. Tell your health care provider if: You often feel depressed. You have ever been abused or do not feel safe at home. Summary Adopting a healthy lifestyle and getting preventive care are important in promoting health and wellness. Follow your health care provider's instructions about healthy diet, exercising, and getting tested or screened for diseases. Follow your health care provider's  instructions on monitoring your cholesterol and blood pressure. This information is not intended to replace advice given to you by your health care provider. Make sure you discuss any questions you have with your healthcare provider. Document Revised: 08/08/2018 Document Reviewed: 08/08/2018 Elsevier Patient Education  2022 Elsevier Inc.  

## 2021-02-17 LAB — COMPLETE METABOLIC PANEL WITH GFR
AG Ratio: 1.5 (calc) (ref 1.0–2.5)
ALT: 15 U/L (ref 6–29)
AST: 14 U/L (ref 10–35)
Albumin: 4.3 g/dL (ref 3.6–5.1)
Alkaline phosphatase (APISO): 59 U/L (ref 37–153)
BUN: 19 mg/dL (ref 7–25)
CO2: 29 mmol/L (ref 20–32)
Calcium: 9.7 mg/dL (ref 8.6–10.4)
Chloride: 100 mmol/L (ref 98–110)
Creat: 0.8 mg/dL (ref 0.50–1.05)
GFR, Est African American: 97 mL/min/{1.73_m2} (ref >=60)
GFR, Est Non African American: 84 mL/min/{1.73_m2} (ref >=60)
Globulin: 2.9 g/dL (calc) (ref 1.9–3.7)
Glucose, Bld: 118 mg/dL — ABNORMAL HIGH (ref 65–99)
Potassium: 3.9 mmol/L (ref 3.5–5.3)
Sodium: 138 mmol/L (ref 135–146)
Total Bilirubin: 0.4 mg/dL (ref 0.2–1.2)
Total Protein: 7.2 g/dL (ref 6.1–8.1)

## 2021-02-17 LAB — CBC WITH DIFFERENTIAL/PLATELET
Absolute Monocytes: 720 cells/uL (ref 200–950)
Basophils Absolute: 36 cells/uL (ref 0–200)
Basophils Relative: 0.4 %
Eosinophils Absolute: 99 cells/uL (ref 15–500)
Eosinophils Relative: 1.1 %
HCT: 39 % (ref 35.0–45.0)
Hemoglobin: 13 g/dL (ref 11.7–15.5)
Lymphs Abs: 2772 cells/uL (ref 850–3900)
MCH: 29.8 pg (ref 27.0–33.0)
MCHC: 33.3 g/dL (ref 32.0–36.0)
MCV: 89.4 fL (ref 80.0–100.0)
MPV: 10.3 fL (ref 7.5–12.5)
Monocytes Relative: 8 %
Neutro Abs: 5373 cells/uL (ref 1500–7800)
Neutrophils Relative %: 59.7 %
Platelets: 325 10*3/uL (ref 140–400)
RBC: 4.36 10*6/uL (ref 3.80–5.10)
RDW: 12.8 % (ref 11.0–15.0)
Total Lymphocyte: 30.8 %
WBC: 9 10*3/uL (ref 3.8–10.8)

## 2021-02-17 LAB — LIPID PANEL W/REFLEX DIRECT LDL
Cholesterol: 157 mg/dL (ref <200)
HDL: 52 mg/dL (ref >=50)
LDL Cholesterol (Calc): 72 mg/dL (calc)
Non-HDL Cholesterol (Calc): 105 mg/dL (calc) (ref <130)
Total CHOL/HDL Ratio: 3 (calc) (ref <5.0)
Triglycerides: 237 mg/dL — ABNORMAL HIGH (ref <150)

## 2021-02-17 LAB — TSH: TSH: 2.64 mIU/L

## 2021-02-17 LAB — HEMOGLOBIN A1C W/OUT EAG: Hgb A1c MFr Bld: 5.7 % of total Hgb — ABNORMAL HIGH (ref <5.7)

## 2021-03-02 ENCOUNTER — Other Ambulatory Visit: Payer: Self-pay | Admitting: Neurology

## 2021-03-02 MED ORDER — SEMAGLUTIDE (2 MG/DOSE) 8 MG/3ML ~~LOC~~ SOPN: 2.0000 mg | PEN_INJECTOR | SUBCUTANEOUS | 0 refills | Status: DC

## 2021-03-02 NOTE — Telephone Encounter (Signed)
[facility] called and left vm stating they needed clarification on Ozempic. This was sent for the 1 mg dosage, but states to inject 2 mg weekly. Okay to change to 2 mg dosage? Sign if appropriate.

## 2021-03-05 ENCOUNTER — Other Ambulatory Visit (HOSPITAL_COMMUNITY): Payer: Self-pay

## 2021-03-08 ENCOUNTER — Other Ambulatory Visit: Payer: Self-pay | Admitting: Physician Assistant

## 2021-03-08 ENCOUNTER — Other Ambulatory Visit (HOSPITAL_BASED_OUTPATIENT_CLINIC_OR_DEPARTMENT_OTHER): Payer: Self-pay

## 2021-03-08 DIAGNOSIS — E782 Mixed hyperlipidemia: Secondary | ICD-10-CM

## 2021-03-08 MED FILL — Olmesartan Medoxomil-Hydrochlorothiazide Tab 40-25 MG: ORAL | 90 days supply | Qty: 90 | Fill #0 | Status: AC

## 2021-03-09 ENCOUNTER — Other Ambulatory Visit (HOSPITAL_BASED_OUTPATIENT_CLINIC_OR_DEPARTMENT_OTHER): Payer: Self-pay

## 2021-03-09 ENCOUNTER — Encounter: Payer: Self-pay | Admitting: Physician Assistant

## 2021-03-09 DIAGNOSIS — E782 Mixed hyperlipidemia: Secondary | ICD-10-CM

## 2021-03-09 MED ORDER — ATORVASTATIN CALCIUM 40 MG PO TABS
ORAL_TABLET | Freq: Every day | ORAL | 3 refills | Status: DC
  Filled 2021-03-09: qty 90, 90d supply, fill #0
  Filled 2021-06-14: qty 90, 90d supply, fill #1
  Filled 2021-09-29: qty 90, 90d supply, fill #2
  Filled 2022-01-19: qty 90, 90d supply, fill #3

## 2021-04-07 ENCOUNTER — Other Ambulatory Visit (HOSPITAL_BASED_OUTPATIENT_CLINIC_OR_DEPARTMENT_OTHER): Payer: Self-pay

## 2021-04-07 ENCOUNTER — Other Ambulatory Visit: Payer: Self-pay | Admitting: Physician Assistant

## 2021-04-07 MED ORDER — OZEMPIC (1 MG/DOSE) 4 MG/3ML ~~LOC~~ SOPN
1.0000 mg | PEN_INJECTOR | SUBCUTANEOUS | 1 refills | Status: DC
  Filled 2021-04-07: qty 9, 84d supply, fill #0
  Filled 2021-07-01: qty 9, 84d supply, fill #1

## 2021-06-14 ENCOUNTER — Other Ambulatory Visit (HOSPITAL_BASED_OUTPATIENT_CLINIC_OR_DEPARTMENT_OTHER): Payer: Self-pay

## 2021-06-14 MED FILL — Olmesartan Medoxomil-Hydrochlorothiazide Tab 40-25 MG: ORAL | 90 days supply | Qty: 90 | Fill #1 | Status: AC

## 2021-06-15 ENCOUNTER — Encounter: Payer: No Typology Code available for payment source | Admitting: Family Medicine

## 2021-06-22 ENCOUNTER — Encounter: Payer: No Typology Code available for payment source | Admitting: Family Medicine

## 2021-07-01 ENCOUNTER — Other Ambulatory Visit: Payer: Self-pay | Admitting: Physician Assistant

## 2021-07-01 ENCOUNTER — Other Ambulatory Visit (HOSPITAL_BASED_OUTPATIENT_CLINIC_OR_DEPARTMENT_OTHER): Payer: Self-pay

## 2021-07-01 DIAGNOSIS — Z1231 Encounter for screening mammogram for malignant neoplasm of breast: Secondary | ICD-10-CM

## 2021-07-07 ENCOUNTER — Ambulatory Visit: Payer: No Typology Code available for payment source

## 2021-07-14 ENCOUNTER — Ambulatory Visit (INDEPENDENT_AMBULATORY_CARE_PROVIDER_SITE_OTHER): Payer: No Typology Code available for payment source

## 2021-07-14 ENCOUNTER — Other Ambulatory Visit: Payer: Self-pay

## 2021-07-14 DIAGNOSIS — Z1231 Encounter for screening mammogram for malignant neoplasm of breast: Secondary | ICD-10-CM | POA: Diagnosis not present

## 2021-07-14 NOTE — Progress Notes (Signed)
Normal mammogram. Follow up in 1 year.

## 2021-08-10 ENCOUNTER — Encounter: Payer: No Typology Code available for payment source | Admitting: Student

## 2021-08-27 ENCOUNTER — Other Ambulatory Visit (HOSPITAL_BASED_OUTPATIENT_CLINIC_OR_DEPARTMENT_OTHER): Payer: Self-pay

## 2021-08-27 MED FILL — Naproxen Tab 500 MG: ORAL | 30 days supply | Qty: 60 | Fill #0 | Status: AC

## 2021-09-24 ENCOUNTER — Other Ambulatory Visit (HOSPITAL_BASED_OUTPATIENT_CLINIC_OR_DEPARTMENT_OTHER): Payer: Self-pay

## 2021-09-24 ENCOUNTER — Other Ambulatory Visit: Payer: Self-pay | Admitting: Physician Assistant

## 2021-09-24 ENCOUNTER — Telehealth: Payer: Self-pay | Admitting: Physician Assistant

## 2021-09-24 MED ORDER — SEMAGLUTIDE (2 MG/DOSE) 8 MG/3ML ~~LOC~~ SOPN
2.0000 mg | PEN_INJECTOR | SUBCUTANEOUS | 0 refills | Status: DC
  Filled 2021-09-24: qty 9, 84d supply, fill #0

## 2021-09-24 NOTE — Telephone Encounter (Signed)
Pt has an appt scheduled for 2/7 for medication refill. But she is out of Ozempic now.

## 2021-09-29 MED FILL — Olmesartan Medoxomil-Hydrochlorothiazide Tab 40-25 MG: ORAL | 90 days supply | Qty: 90 | Fill #2 | Status: AC

## 2021-09-30 ENCOUNTER — Other Ambulatory Visit (HOSPITAL_BASED_OUTPATIENT_CLINIC_OR_DEPARTMENT_OTHER): Payer: Self-pay

## 2021-10-05 ENCOUNTER — Ambulatory Visit (INDEPENDENT_AMBULATORY_CARE_PROVIDER_SITE_OTHER): Payer: No Typology Code available for payment source | Admitting: Nurse Practitioner

## 2021-10-05 ENCOUNTER — Other Ambulatory Visit: Payer: Self-pay

## 2021-10-05 ENCOUNTER — Encounter: Payer: Self-pay | Admitting: Nurse Practitioner

## 2021-10-05 ENCOUNTER — Ambulatory Visit: Payer: No Typology Code available for payment source | Admitting: Physician Assistant

## 2021-10-05 ENCOUNTER — Other Ambulatory Visit (HOSPITAL_COMMUNITY)
Admission: RE | Admit: 2021-10-05 | Discharge: 2021-10-05 | Disposition: A | Discharge status: Home / Self Care | Payer: No Typology Code available for payment source | Source: Ambulatory Visit

## 2021-10-05 VITALS — BP 124/66 | HR 79 | Ht 60.0 in | Wt 182.9 lb

## 2021-10-05 DIAGNOSIS — Z124 Encounter for screening for malignant neoplasm of cervix: Secondary | ICD-10-CM | POA: Insufficient documentation

## 2021-10-05 DIAGNOSIS — Z01419 Encounter for gynecological examination (general) (routine) without abnormal findings: Secondary | ICD-10-CM

## 2021-10-05 DIAGNOSIS — L570 Actinic keratosis: Secondary | ICD-10-CM | POA: Insufficient documentation

## 2021-10-05 NOTE — Progress Notes (Signed)
GYNECOLOGY ANNUAL PREVENTATIVE CARE ENCOUNTER NOTE  Subjective:   Kristen Bright is a 56 y.o. No obstetric history on file. female here for a routine annual gynecologic exam.  Current complaints: none - was wondering about menopause - currently no symptoms.    Denies abnormal vaginal bleeding, discharge, pelvic pain, problems with intercourse or other gynecologic concerns.    Gynecologic History No LMP recorded. Patient has had an ablation. Contraception:  ablation and no menses Last Pap: 07-2010. Results were: normal Last mammogram: 06-2021. Results were: normal  Obstetric History OB History  No obstetric history on file.    Past Medical History:  Diagnosis Date   Anxiety     Past Surgical History:  Procedure Laterality Date   CESAREAN SECTION     CHOLECYSTECTOMY      Current Outpatient Medications on File Prior to Visit  Medication Sig Dispense Refill   atorvastatin (LIPITOR) 40 MG tablet TAKE 1 TABLET BY MOUTH ONCE DAILY 90 tablet 3   cyclobenzaprine (FLEXERIL) 10 MG tablet TAKE 1 TABLET BY MOUTH AT BEDTIME THEN INCREASE GRADUALLY TO 1 TABLET 3 TIMES A DAY 30 tablet 0   Multiple Vitamin (MULTIVITAMIN) capsule Take 1 capsule by mouth daily.     naproxen (NAPROSYN) 500 MG tablet TAKE 1 TABLET BY MOUTH TWICE DAILY WITH MEALS 60 tablet 2   olmesartan-hydrochlorothiazide (BENICAR HCT) 40-25 MG tablet TAKE 1 TABLET BY MOUTH ONCE DAILY 90 tablet 3   pantoprazole (PROTONIX) 40 MG tablet TAKE 1 TABLET BY MOUTH ONCE DAILY 90 tablet 3   Semaglutide, 2 MG/DOSE, 8 MG/3ML SOPN Inject 2 mg into the skin once a week. 9 mL 0   No current facility-administered medications on file prior to visit.    Allergies  Allergen Reactions   Lisinopril     Swelling  Per patient history     Social History   Socioeconomic History   Marital status: Married    Spouse name: Ronalee Belts   Number of children: Not on file   Years of education: Not on file   Highest education level: Not on file   Occupational History    Employer: [email or web address].    Comment: [employer]  Tobacco Use   Smoking status: Former    Types: Cigarettes   Smokeless tobacco: Never   Tobacco comments:    Says smoked socially for about 3 years when younger  Substance and Sexual Activity   Alcohol use: Yes    Comment: rarely   Drug use: Yes   Sexual activity: Yes    Partners: Male  Other Topics Concern   Not on file  Social History Narrative   Some exercise.     Social Determinants of Health   Financial Resource Strain: Not on file  Food Insecurity: Not on file  Transportation Needs: Not on file  Physical Activity: Not on file  Stress: Not on file  Social Connections: Not on file  Intimate Partner Violence: Not on file    Family History  Problem Relation Age of Onset   Diabetes Other        fam hx   Hyperlipidemia Other        fam hx   Hypertension Other        fam hx   Heart disease Other        fam hx   Heart attack Father    Hyperlipidemia Father    Hypertension Father     The following portions of the patient's history  were reviewed and updated as appropriate: allergies, current medications, past family history, past medical history, past social history, past surgical history and problem list.  Review of Systems Pertinent items noted in HPI and remainder of comprehensive ROS otherwise negative.   Objective:  BP 124/66    Pulse 79    Ht 5' (1.524 m)    Wt 182 lb 14.4 oz (83 kg)    BMI 35.72 kg/m  CONSTITUTIONAL: Well-developed, well-nourished female in no acute distress.  HENT:  Normocephalic, atraumatic, External right and left ear normal.  EYES: Conjunctivae and EOM are normal. Pupils are equal, round.  No scleral icterus.  NECK: Normal range of motion, supple, no masses.  Normal thyroid.  SKIN: Skin is warm and dry. No rash noted. Not diaphoretic. No erythema. No pallor. NEUROLOGIC: Alert and oriented to person, place, and time. Normal reflexes, muscle tone  coordination. No cranial nerve deficit noted. PSYCHIATRIC: Normal mood and affect. Normal behavior. Normal judgment and thought content. CARDIOVASCULAR: Normal heart rate noted, regular rhythm RESPIRATORY: Clear to auscultation bilaterally. Effort and breath sounds normal, no problems with respiration noted. BREASTS: declines - mammogram in November 2022 ABDOMEN: Soft, no distention noted.  No tenderness, rebound or guarding.  PELVIC: Normal appearing external genitalia; normal appearing vaginal mucosa and cervix.  No abnormal discharge noted.  Pap smear obtained.  Normal uterine size, no other palpable masses, no uterine or adnexal tenderness. MUSCULOSKELETAL: Normal range of motion. No tenderness.  No cyanosis, clubbing, or edema.    Assessment and Plan:  1. Encounter for well woman exam with routine gynecological exam Has PCP she sees regularly to manage other health problems Discussed Blood draw for [facility] but since she has no symptoms, decided not to have done Is slightly past the average age for menopause - has had no menses since ablation done around 2015.  2. Screening for cervical cancer  - Cytology - PAP( [place])  Will follow up results of pap smear and manage accordingly. Mammogram will be due again in November 2023 Routine preventative health maintenance measures emphasized. Please refer to After Visit Summary for other counseling recommendations.    Earlie Server, RN, MSN, NP-BC Nurse Practitioner, [place] [facility]

## 2021-10-07 LAB — CYTOLOGY - PAP
Comment: NEGATIVE
Diagnosis: NEGATIVE
High risk HPV: NEGATIVE

## 2021-10-26 ENCOUNTER — Ambulatory Visit: Payer: No Typology Code available for payment source | Admitting: Physician Assistant

## 2021-11-16 ENCOUNTER — Ambulatory Visit: Payer: No Typology Code available for payment source | Admitting: Physician Assistant

## 2021-12-14 ENCOUNTER — Ambulatory Visit (INDEPENDENT_AMBULATORY_CARE_PROVIDER_SITE_OTHER): Payer: No Typology Code available for payment source | Admitting: Physician Assistant

## 2021-12-14 ENCOUNTER — Other Ambulatory Visit (HOSPITAL_BASED_OUTPATIENT_CLINIC_OR_DEPARTMENT_OTHER): Payer: Self-pay

## 2021-12-14 VITALS — BP 110/65 | HR 78 | Ht 60.0 in | Wt 181.0 lb

## 2021-12-14 DIAGNOSIS — Z6835 Body mass index (BMI) 35.0-35.9, adult: Secondary | ICD-10-CM

## 2021-12-14 DIAGNOSIS — R635 Abnormal weight gain: Secondary | ICD-10-CM

## 2021-12-14 DIAGNOSIS — E66812 Obesity, class 2: Secondary | ICD-10-CM

## 2021-12-14 DIAGNOSIS — N951 Menopausal and female climacteric states: Secondary | ICD-10-CM | POA: Diagnosis not present

## 2021-12-14 DIAGNOSIS — E1165 Type 2 diabetes mellitus with hyperglycemia: Secondary | ICD-10-CM | POA: Diagnosis not present

## 2021-12-14 DIAGNOSIS — I10 Essential (primary) hypertension: Secondary | ICD-10-CM

## 2021-12-14 DIAGNOSIS — R140 Abdominal distension (gaseous): Secondary | ICD-10-CM

## 2021-12-14 LAB — POCT GLYCOSYLATED HEMOGLOBIN (HGB A1C): Hemoglobin A1C: 6 % — AB (ref 4.0–5.6)

## 2021-12-14 MED ORDER — BUPROPION HCL ER (SR) 100 MG PO TB12
100.0000 mg | ORAL_TABLET | Freq: Two times a day (BID) | ORAL | 2 refills | Status: DC
  Filled 2021-12-14: qty 60, 30d supply, fill #0

## 2021-12-14 NOTE — Progress Notes (Signed)
? ?Subjective:  ? ? Patient ID: Kristen Bright, female    DOB: 20-Dec-1965, 56 y.o.   MRN: [id number] ? ?HPI ?Pt is a 50 obese female with T2DM, OSA, HTN, GERD who presents to the clinic for follow up and medication refills.  ? ?Pt is doing ok. She is very frustrated with weight. She does not feel like ozempic has helped her very much. She feels even more bloated. No CP, palpitations, headaches or vision changes. No open sores or wounds. No hypoglycemia. She is trying to be active and make better food decisions but she feels stuck. She is having some night sweats and feel "ugh" she wonders if it could be menopause.  ? ?.. ?Active Ambulatory Problems  ?  Diagnosis Date Noted  ? ELEVATED BLOOD PRESSURE WITHOUT DIAGNOSIS OF HYPERTENSION 11/09/2010  ? OSA (obstructive sleep apnea) 09/30/2013  ? Heart murmur 06/24/2014  ? Fatty liver disease, nonalcoholic 63/08/6008  ? Class 1 obesity due to excess calories with serious comorbidity and body mass index (BMI) of 33.0 to 33.9 in adult 11/04/2014  ? Weight gain 11/04/2014  ? Stress 09/14/2015  ? GAD (generalized anxiety disorder) 09/14/2015  ? Essential hypertension, benign 09/14/2015  ? DDD (degenerative disc disease), cervical 08/18/2016  ? Mixed hyperlipidemia 01/18/2017  ? Memory changes 11/19/2017  ? Type 2 diabetes mellitus with hyperglycemia, without long-term current use of insulin ([place]) 06/25/2019  ? Low serum potassium 06/26/2019  ? Gastroesophageal reflux disease 06/26/2019  ? History of COVID-19 09/23/2019  ? Throat congestion 02/16/2021  ? Actinic keratosis 10/05/2021  ? Menopausal symptoms 12/14/2021  ? Abdominal bloating 12/14/2021  ? ?Resolved Ambulatory Problems  ?  Diagnosis Date Noted  ? ANXIETY 02/28/2010  ? MOOD DISORDER 11/09/2010  ? IFG (impaired fasting glucose) 05/28/2013  ? Anemia, iron deficiency 10/02/2013  ? Low iron stores 10/02/2013  ? BMI 34.0-34.9,adult 11/04/2014  ? CN (constipation) 05/06/2015  ? Abnormal weight loss 05/06/2015  ? ?Past  Medical History:  ?Diagnosis Date  ? Anxiety   ? ? ? ? ?Review of Systems  ?All other systems reviewed and are negative. ? ?   ?Objective:  ? Physical Exam ?Vitals reviewed.  ?Constitutional:   ?   Appearance: Normal appearance. She is obese.  ?HENT:  ?   Head: Normocephalic.  ?Cardiovascular:  ?   Rate and Rhythm: Normal rate and regular rhythm.  ?   Pulses: Normal pulses.  ?   Heart sounds: Normal heart sounds.  ?Pulmonary:  ?   Effort: Pulmonary effort is normal.  ?   Breath sounds: Normal breath sounds.  ?Abdominal:  ?   General: There is no distension.  ?   Palpations: Abdomen is soft.  ?   Tenderness: There is no abdominal tenderness. There is no guarding.  ?Neurological:  ?   General: No focal deficit present.  ?   Mental Status: She is alert and oriented to person, place, and time.  ?Psychiatric:     ?   Mood and Affect: Mood normal.  ? ? ? ? ?.. ?Results for orders placed or performed in visit on 12/14/21  ?COMPLETE METABOLIC PANEL WITH GFR  ?Result Value Ref Range  ? Glucose, Bld 65 65 - 99 mg/dL  ? BUN 16 7 - 25 mg/dL  ? Creat 0.74 0.50 - 1.03 mg/dL  ? eGFR 96 > OR = 60 mL/min/1.27m  ? BUN/Creatinine Ratio NOT APPLICABLE 6 - 22 (calc)  ? Sodium 136 135 - 146 mmol/L  ? Potassium  3.4 (L) 3.5 - 5.3 mmol/L  ? Chloride 98 98 - 110 mmol/L  ? CO2 29 20 - 32 mmol/L  ? Calcium 9.5 8.6 - 10.4 mg/dL  ? Total Protein 7.2 6.1 - 8.1 g/dL  ? Albumin 4.3 3.6 - 5.1 g/dL  ? Globulin 2.9 1.9 - 3.7 g/dL (calc)  ? AG Ratio 1.5 1.0 - 2.5 (calc)  ? Total Bilirubin 0.6 0.2 - 1.2 mg/dL  ? Alkaline phosphatase (APISO) 63 37 - 153 U/L  ? AST 15 10 - 35 U/L  ? ALT 17 6 - 29 U/L  ?FSH/LH  ?Result Value Ref Range  ? FSH 8.6 mIU/mL  ? LH 10.3 mIU/mL  ?Estradiol  ?Result Value Ref Range  ? Estradiol 491 (H) pg/mL  ?CP Testosterone, BIO-Female/Children  ?Result Value Ref Range  ? Albumin 4.3 3.6 - 5.1 g/dL  ? Sex Hormone Binding 120.1 17 - 124 nmol/L  ? Testosterone, Free 2.0 0.2 - 5.0 pg/mL  ? TESTOSTERONE, BIOAVAILABLE 4.0 0.5 - 8.5  ng/dL  ? Testosterone, Total, LC-MS-MS 52 (H) 2 - 45 ng/dL  ?Progesterone  ?Result Value Ref Range  ? Progesterone <0.5 ng/mL  ?TSH  ?Result Value Ref Range  ? TSH 2.79 mIU/L  ?POCT glycosylated hemoglobin (Hb A1C)  ?Result Value Ref Range  ? Hemoglobin A1C 6.0 (A) 4.0 - 5.6 %  ? HbA1c POC (<> result, manual entry)    ? HbA1c, POC (prediabetic range)    ? HbA1c, POC (controlled diabetic range)    ? ? ?   ?Assessment & Plan:  ?..Kristen Bright was seen today for follow-up. ? ?Diagnoses and all orders for this visit: ? ?Type 2 diabetes mellitus with hyperglycemia, without long-term current use of insulin (HCC) ?-     COMPLETE METABOLIC PANEL WITH GFR ?-     POCT glycosylated hemoglobin (Hb A1C) ?-     Ambulatory referral to diabetic education ? ?Essential hypertension, benign ?-     COMPLETE METABOLIC PANEL WITH GFR ? ?Weight gain ?-     buPROPion ER (WELLBUTRIN SR) 100 MG 12 hr tablet; Take 1 tablet (100 mg total) by mouth 2 (two) times daily. ?-     FSH/LH ?-     Estradiol ?-     CP Testosterone, BIO-Female/Children ?-     Progesterone ?-     TSH ?-     Ambulatory referral to diabetic education ? ?Menopausal symptoms ?-     buPROPion ER (WELLBUTRIN SR) 100 MG 12 hr tablet; Take 1 tablet (100 mg total) by mouth 2 (two) times daily. ?-     FSH/LH ?-     Estradiol ?-     CP Testosterone, BIO-Female/Children ?-     Progesterone ?-     TSH ? ?Abdominal bloating ? ?Class 2 severe obesity due to excess calories with serious comorbidity and body mass index (BMI) of 35.0 to 35.9 in adult [facility]) ?-     buPROPion ER (WELLBUTRIN SR) 100 MG 12 hr tablet; Take 1 tablet (100 mg total) by mouth 2 (two) times daily. ?-     Ambulatory referral to diabetic education ? ? ?A1C still to goal but up some from last visit ?Would consider increase of ozempic but since she is having so many bloating symptoms will keep at 87m weekly ?Added wellbutrin to see if would help with mood and weight. ?Will make nutritionist referral to look at diet and see if  we can control sugars more with diet ?BP looks great ?  On statin.  ?Vaccines UTD ?Follow up in 3 months.  ? ?

## 2021-12-14 NOTE — Patient Instructions (Addendum)
Referral to nutritionist  ?Start wellbutrin twice a day ?Stay on ozempic for now ?Consider ultrasound if staying bloated ?

## 2021-12-15 ENCOUNTER — Encounter: Payer: Self-pay | Admitting: Physician Assistant

## 2021-12-15 ENCOUNTER — Other Ambulatory Visit: Payer: Self-pay | Admitting: Neurology

## 2021-12-15 DIAGNOSIS — E280 Estrogen excess: Secondary | ICD-10-CM

## 2021-12-15 DIAGNOSIS — E876 Hypokalemia: Secondary | ICD-10-CM

## 2021-12-15 NOTE — Progress Notes (Signed)
Potassium is just a little low. Likely could be fixed with increasing potassium rich foods and recheck in 1-2 weeks.  ? ?Your estrogen is actually really high.  ?You are NOT in menopause.  ?Waiting for testosterone to result. ? ?You are not on any medications that should increase estrogen level. I would like to recheck in 2 weeks to see if this is accurate level.

## 2021-12-16 ENCOUNTER — Encounter: Payer: Self-pay | Admitting: Neurology

## 2021-12-17 ENCOUNTER — Other Ambulatory Visit (HOSPITAL_BASED_OUTPATIENT_CLINIC_OR_DEPARTMENT_OTHER): Payer: Self-pay

## 2021-12-17 ENCOUNTER — Other Ambulatory Visit: Payer: Self-pay | Admitting: Physician Assistant

## 2021-12-17 LAB — CP TESTOSTERONE, BIO-FEMALE/CHILDREN
Albumin: 4.3 g/dL (ref 3.6–5.1)
Sex Hormone Binding: 120.1 nmol/L (ref 17–124)
TESTOSTERONE, BIOAVAILABLE: 4 ng/dL (ref 0.5–8.5)
Testosterone, Free: 2 pg/mL (ref 0.2–5.0)
Testosterone, Total, LC-MS-MS: 52 ng/dL — ABNORMAL HIGH (ref 2–45)

## 2021-12-17 LAB — COMPLETE METABOLIC PANEL WITH GFR
AG Ratio: 1.5 (calc) (ref 1.0–2.5)
ALT: 17 U/L (ref 6–29)
AST: 15 U/L (ref 10–35)
Albumin: 4.3 g/dL (ref 3.6–5.1)
Alkaline phosphatase (APISO): 63 U/L (ref 37–153)
BUN: 16 mg/dL (ref 7–25)
CO2: 29 mmol/L (ref 20–32)
Calcium: 9.5 mg/dL (ref 8.6–10.4)
Chloride: 98 mmol/L (ref 98–110)
Creat: 0.74 mg/dL (ref 0.50–1.03)
Globulin: 2.9 g/dL (calc) (ref 1.9–3.7)
Glucose, Bld: 65 mg/dL (ref 65–99)
Potassium: 3.4 mmol/L — ABNORMAL LOW (ref 3.5–5.3)
Sodium: 136 mmol/L (ref 135–146)
Total Bilirubin: 0.6 mg/dL (ref 0.2–1.2)
Total Protein: 7.2 g/dL (ref 6.1–8.1)
eGFR: 96 mL/min/{1.73_m2} (ref >=60)

## 2021-12-17 LAB — FSH/LH
FSH: 8.6 m[IU]/mL
LH: 10.3 m[IU]/mL

## 2021-12-17 LAB — TSH: TSH: 2.79 mIU/L

## 2021-12-17 LAB — PROGESTERONE: Progesterone: <0.5 ng/mL

## 2021-12-17 LAB — ESTRADIOL: Estradiol: 491 pg/mL — ABNORMAL HIGH

## 2021-12-17 MED ORDER — OZEMPIC (1 MG/DOSE) 4 MG/3ML ~~LOC~~ SOPN
1.0000 mg | PEN_INJECTOR | SUBCUTANEOUS | 0 refills | Status: DC
  Filled 2021-12-17: qty 9, 84d supply, fill #0

## 2021-12-17 NOTE — Telephone Encounter (Signed)
Last note states she was staying on Ozempic but doesn't state any change in dosage, please advise.  ?

## 2021-12-20 ENCOUNTER — Other Ambulatory Visit: Payer: Self-pay | Admitting: Physician Assistant

## 2021-12-20 ENCOUNTER — Other Ambulatory Visit (HOSPITAL_BASED_OUTPATIENT_CLINIC_OR_DEPARTMENT_OTHER): Payer: Self-pay

## 2021-12-20 DIAGNOSIS — I10 Essential (primary) hypertension: Secondary | ICD-10-CM

## 2021-12-20 MED ORDER — OLMESARTAN MEDOXOMIL-HCTZ 40-25 MG PO TABS
1.0000 | ORAL_TABLET | Freq: Every day | ORAL | 3 refills | Status: DC
  Filled 2021-12-20: qty 90, 90d supply, fill #0
  Filled 2022-04-25: qty 90, 90d supply, fill #1
  Filled 2022-08-10: qty 90, 90d supply, fill #2

## 2021-12-21 ENCOUNTER — Other Ambulatory Visit (HOSPITAL_BASED_OUTPATIENT_CLINIC_OR_DEPARTMENT_OTHER): Payer: Self-pay

## 2021-12-22 ENCOUNTER — Telehealth: Payer: Self-pay

## 2021-12-22 ENCOUNTER — Other Ambulatory Visit (HOSPITAL_BASED_OUTPATIENT_CLINIC_OR_DEPARTMENT_OTHER): Payer: Self-pay

## 2021-12-22 IMAGING — MG MM DIGITAL SCREENING BILAT W/ TOMO AND CAD
8 series · 8 of 24 positions shown · non-contrast
Comparison: Previous exam(s).

CLINICAL DATA: Screening.

EXAM:
DIGITAL SCREENING BILATERAL MAMMOGRAM WITH TOMOSYNTHESIS AND CAD
TECHNIQUE: Bilateral screening digital craniocaudal and mediolateral oblique
mammograms were obtained. Bilateral screening digital breast
tomosynthesis was performed. The images were evaluated with
computer-aided detection.

[L CC synth-2D]
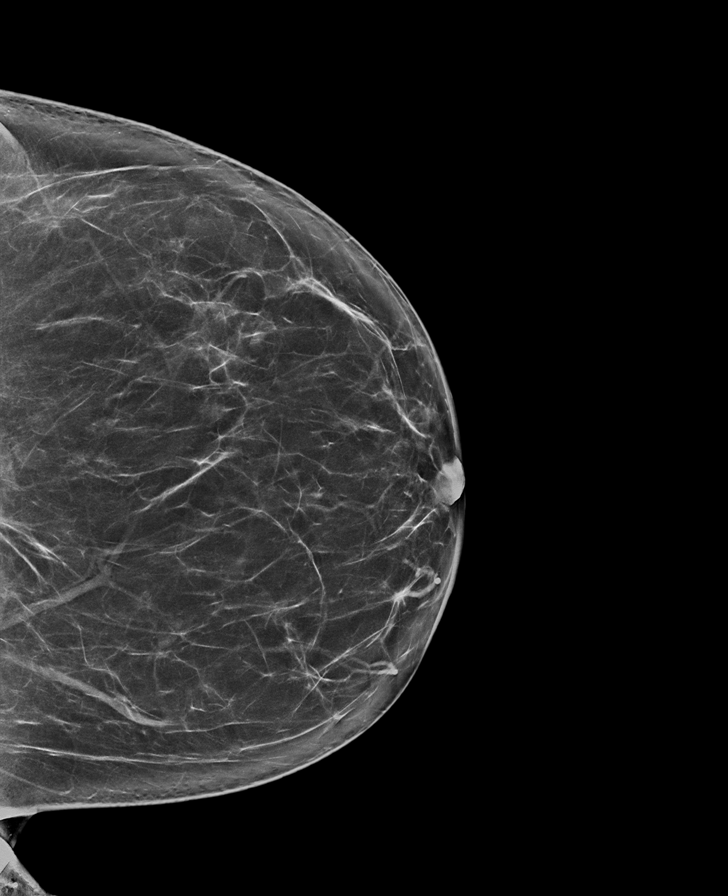

[L MLO synth-2D]
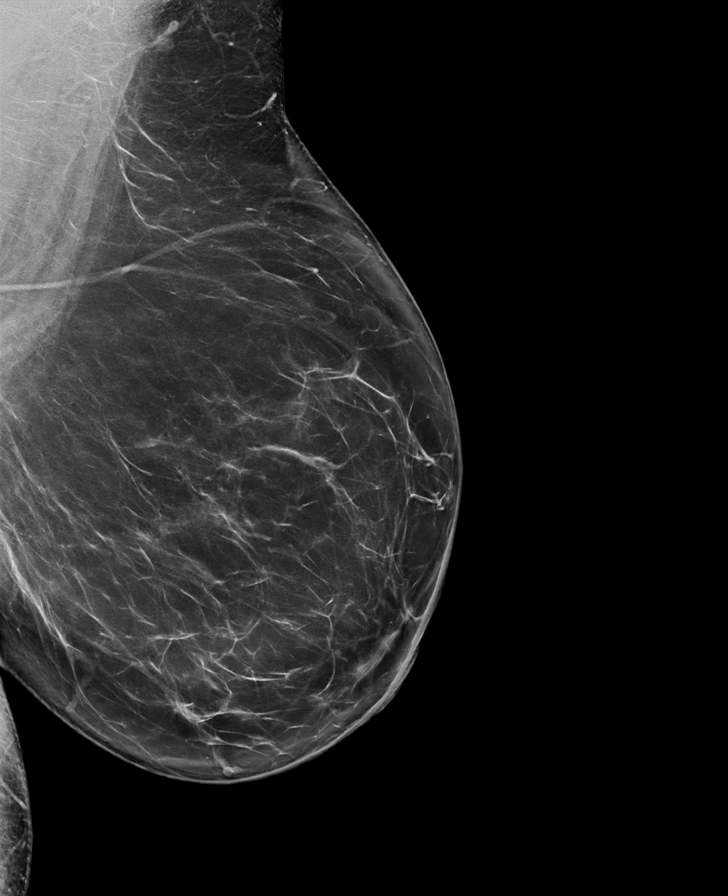

[R CC synth-2D]
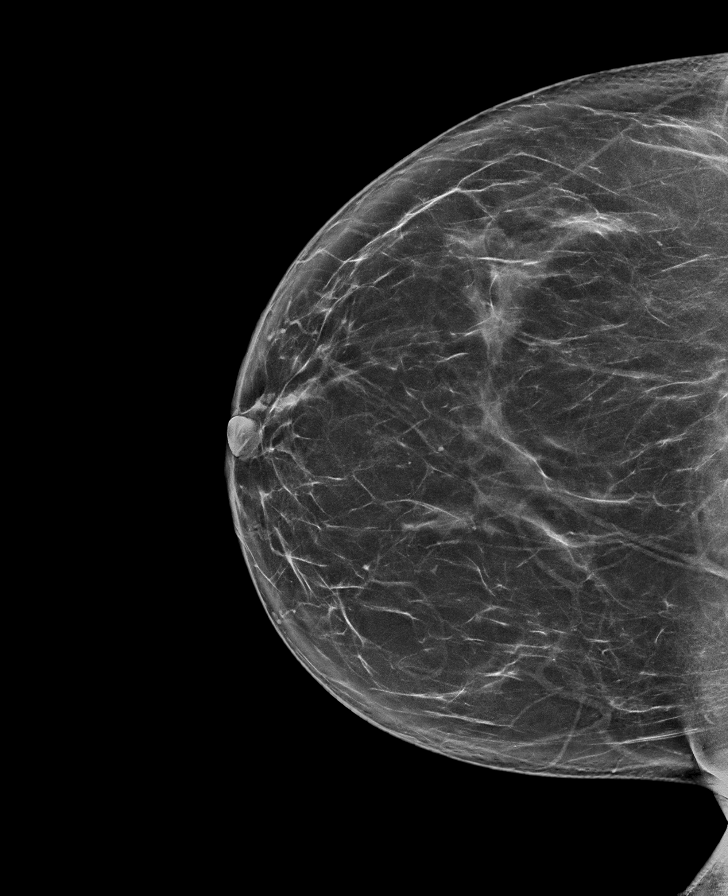

[R MLO synth-2D]
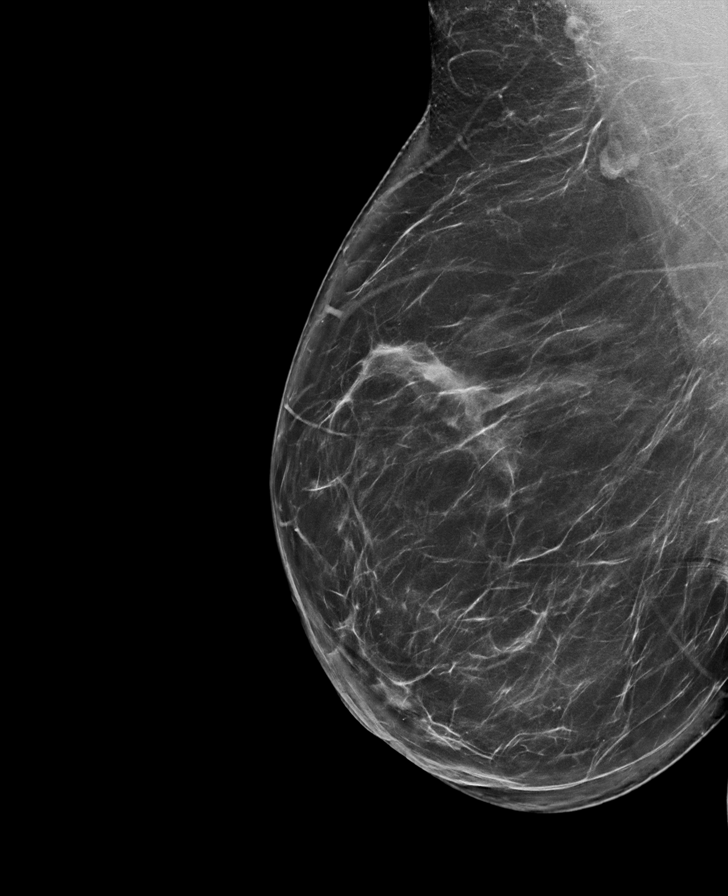

[L MLO tomo · tomo slice 48/95.0]
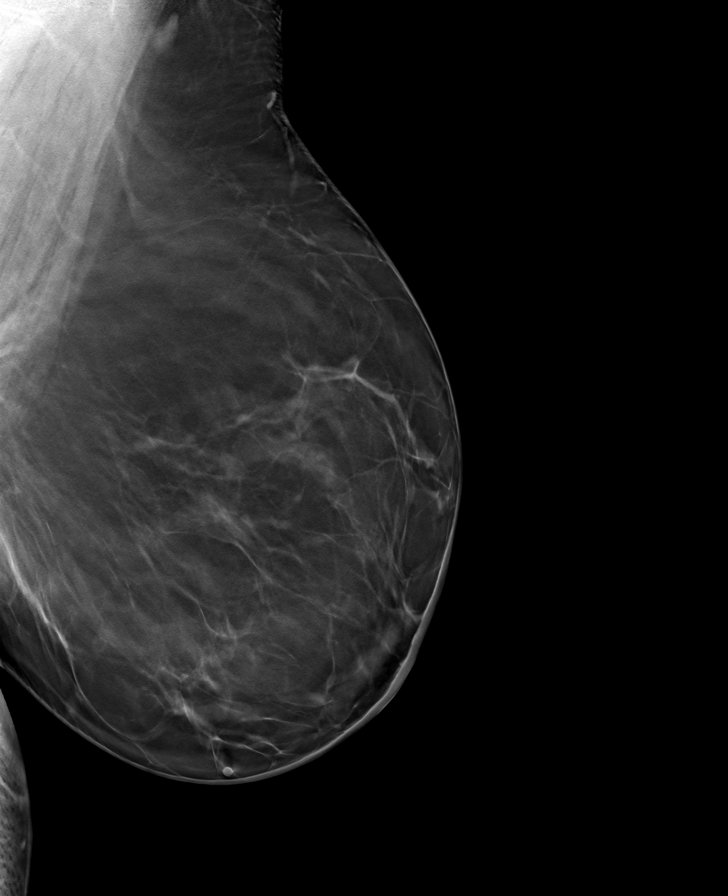

[R CC tomo · tomo slice 43/84.0]
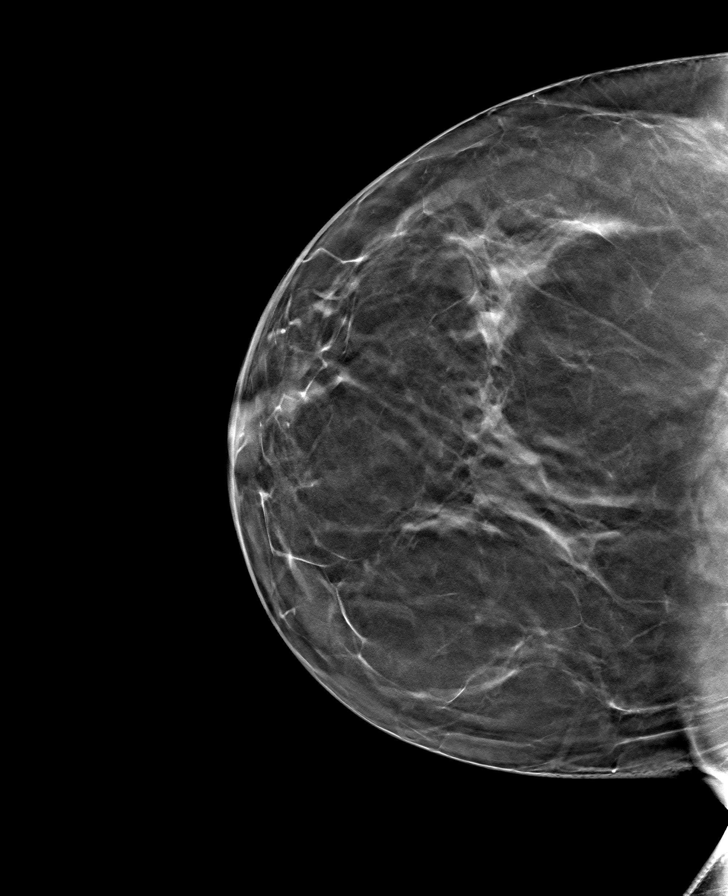

[L CC tomo · tomo slice 42/83.0]
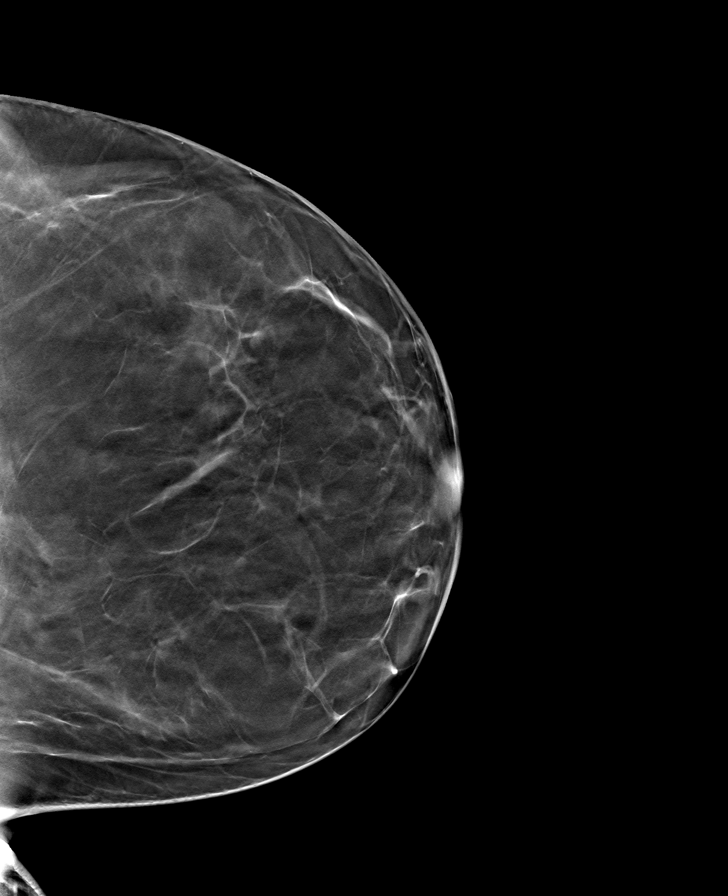

[R MLO tomo · tomo slice 48/95.0]
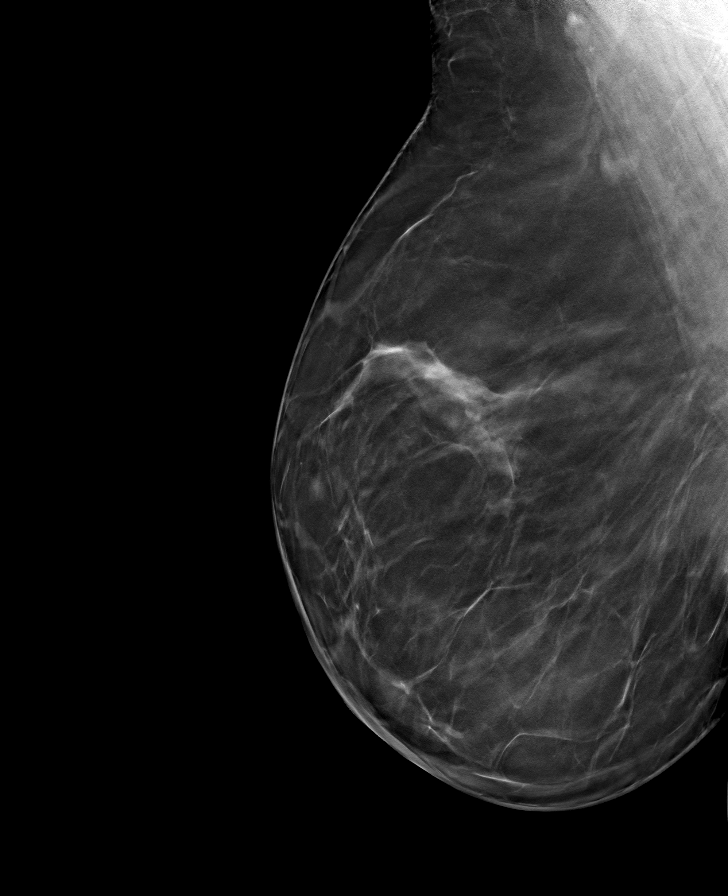

[8 of 24 positions shown; findings below may reference images not displayed]

ACR Breast Density Category b: There are scattered areas of
fibroglandular density.
FINDINGS: There are no findings suspicious for malignancy.
IMPRESSION: No mammographic evidence of malignancy. A result letter of this
screening mammogram will be mailed directly to the patient.

RECOMMENDATION:
Screening mammogram in one year. (Code:51-O-LD2)

BI-RADS CATEGORY  1: Negative.

## 2021-12-22 NOTE — Telephone Encounter (Addendum)
Initiated Prior authorization CLE:[id number] (1 MG/DOSE) '4MG'$ /3ML pen-injectors ?Via: Covermymeds ?Case/Key:[id number]  ?Status: approved  as of 12/22/21 ?Reason:The authorization is good from: 12/22/2021 to 12/22/2022. ?Notified Pt via: Mychart ? ?

## 2021-12-23 ENCOUNTER — Other Ambulatory Visit (HOSPITAL_BASED_OUTPATIENT_CLINIC_OR_DEPARTMENT_OTHER): Payer: Self-pay

## 2022-01-05 LAB — ESTRADIOL: Estradiol: 273 pg/mL

## 2022-01-05 LAB — POTASSIUM: Potassium: 3.6 mmol/L (ref 3.5–5.3)

## 2022-01-05 NOTE — Progress Notes (Signed)
A really good estradiol level but normalized.  ?Potassium is good.

## 2022-01-07 ENCOUNTER — Other Ambulatory Visit (HOSPITAL_BASED_OUTPATIENT_CLINIC_OR_DEPARTMENT_OTHER): Payer: Self-pay

## 2022-01-20 ENCOUNTER — Other Ambulatory Visit (HOSPITAL_BASED_OUTPATIENT_CLINIC_OR_DEPARTMENT_OTHER): Payer: Self-pay

## 2022-02-17 ENCOUNTER — Encounter: Payer: Self-pay | Admitting: Physician Assistant

## 2022-02-22 ENCOUNTER — Other Ambulatory Visit (HOSPITAL_BASED_OUTPATIENT_CLINIC_OR_DEPARTMENT_OTHER): Payer: Self-pay

## 2022-02-22 MED ORDER — VENLAFAXINE HCL ER 37.5 MG PO CP24
37.5000 mg | ORAL_CAPSULE | Freq: Every day | ORAL | 0 refills | Status: DC
  Filled 2022-02-22: qty 30, 30d supply, fill #0

## 2022-03-02 ENCOUNTER — Encounter: Payer: Self-pay | Admitting: Skilled Nursing Facility1

## 2022-03-02 ENCOUNTER — Encounter
Payer: No Typology Code available for payment source | Attending: Physician Assistant | Admitting: Skilled Nursing Facility1

## 2022-03-02 DIAGNOSIS — E669 Obesity, unspecified: Secondary | ICD-10-CM

## 2022-03-02 DIAGNOSIS — Z713 Dietary counseling and surveillance: Secondary | ICD-10-CM | POA: Insufficient documentation

## 2022-03-02 DIAGNOSIS — E1165 Type 2 diabetes mellitus with hyperglycemia: Secondary | ICD-10-CM | POA: Diagnosis not present

## 2022-03-02 DIAGNOSIS — R635 Abnormal weight gain: Secondary | ICD-10-CM | POA: Insufficient documentation

## 2022-03-02 DIAGNOSIS — Z6835 Body mass index (BMI) 35.0-35.9, adult: Secondary | ICD-10-CM | POA: Insufficient documentation

## 2022-03-02 NOTE — Progress Notes (Signed)
Medical Nutrition Therapy  Appointment Start time:  10:18  Appointment End time:  11:12  Primary concerns today: to lose weight  Referral diagnosis: e11.65, e66.01 Preferred learning style: auditory, visual Learning readiness: not ready, contemplating   NUTRITION ASSESSMENT   Clinical Medical Hx: DM Medications: see list Labs:  Notable Signs/Symptoms: N/A  Lifestyle & Dietary Hx   DM: Ozempic '1MG'$   Pt states she sometimes check her blood pressure usually getting about 120/68.  Pt state she would like to get a plan sating she is a member with weight watchers. Pt states she feels she needs better structure. Pt states she knows everything she needs to know about DM and doe snot wish to discuss diabetes in particular would like to focus on her weight.    Pt states her current weight is about 182 pounds but feels her weight should be 135 pounds.   Pt states she is  recent empty nester. Pt states her husband has crohn's disease so she does not eat or cook fruits and vegetables.   Pt states she is not big into vegetables. Pt states she ha a great work life balance. Pt states she quilts as a hobby.  Pt states her husband is a not a cooker.  Pt states she does graze on snack at work.   Estimated daily fluid intake: 40 oz Supplements: multivitamin  Sleep: 8-9 without issue Stress / self-care: pretty low stress  Current average weekly physical activity: ADL's  24-Hr Dietary Recall First Meal: goldfish or peanut butter crackers and diet coke Snack:  Second Meal: mexican: rice and beans shrimp and chicken or chicken salad sandwich and ice cream Snack: candies Third Meal: eaten out or chicken and rice Snack: chips and ice cream Beverages: diet coke, water  Estimated Energy Needs Calories: 1500    NUTRITION INTERVENTION  Nutrition education (E-1) on the following topics:  Creating balanced meals within the context of blood sugar control/DM management with foods Grazing and  excess calories Appropriate snack options  appropriately structured meal times and spacing in between  Stress/bordeum and snacking control  Handouts Provided Include  Detailed MyPlate marked for pt  Learning Style & Readiness for Change Teaching method utilized: Visual & Auditory  Demonstrated degree of understanding via: Teach Back  Barriers to learning/adherence to lifestyle change: licks high sugar snacks/availability at work  Goals Established by Pt Reduce high sugar snacks/grazing behavior Snack options: fruit, vegetable, or yogurt Create a balanced meal for breakfast Avoid diet coke Aim for 64 fluid ounces per day Add in 2-3 resistance days a week Increase cardiovascular activity  Include non starchy vegetables 1 full cup 2 times a day 7 days a week   MONITORING & EVALUATION Dietary intake, weekly physical activity  Next Steps  Patient is to follow up in 2-3 weeks.

## 2022-03-17 ENCOUNTER — Other Ambulatory Visit: Payer: Self-pay | Admitting: Physician Assistant

## 2022-03-17 ENCOUNTER — Other Ambulatory Visit (HOSPITAL_BASED_OUTPATIENT_CLINIC_OR_DEPARTMENT_OTHER): Payer: Self-pay

## 2022-03-17 ENCOUNTER — Telehealth: Payer: Self-pay | Admitting: Physician Assistant

## 2022-03-17 MED ORDER — VENLAFAXINE HCL ER 37.5 MG PO CP24
37.5000 mg | ORAL_CAPSULE | Freq: Every day | ORAL | 0 refills | Status: DC
Start: 2022-03-17 — End: 2022-05-20
  Filled 2022-03-17 – 2022-03-18 (×2): qty 90, 90d supply, fill #0

## 2022-03-17 MED ORDER — OZEMPIC (1 MG/DOSE) 4 MG/3ML ~~LOC~~ SOPN
1.0000 mg | PEN_INJECTOR | SUBCUTANEOUS | 0 refills | Status: DC
  Filled 2022-03-17: qty 9, 84d supply, fill #0

## 2022-03-17 NOTE — Telephone Encounter (Signed)
Pt has an upcoming appt with [place] [facility] on August 18th but she will be out of her meds before then so can you refill her Ozempic and Effexor? Thank you

## 2022-03-18 ENCOUNTER — Other Ambulatory Visit (HOSPITAL_BASED_OUTPATIENT_CLINIC_OR_DEPARTMENT_OTHER): Payer: Self-pay

## 2022-03-22 ENCOUNTER — Other Ambulatory Visit (HOSPITAL_BASED_OUTPATIENT_CLINIC_OR_DEPARTMENT_OTHER): Payer: Self-pay

## 2022-03-22 ENCOUNTER — Other Ambulatory Visit: Payer: Self-pay

## 2022-03-22 DIAGNOSIS — E1165 Type 2 diabetes mellitus with hyperglycemia: Secondary | ICD-10-CM

## 2022-03-22 MED ORDER — FREESTYLE LITE TEST VI STRP
ORAL_STRIP | 12 refills | Status: AC
  Filled 2022-03-22: qty 100, 50d supply, fill #0

## 2022-03-22 MED ORDER — FREESTYLE LITE W/DEVICE KIT
PACK | 99 refills | Status: AC
  Filled 2022-03-22: qty 1, 30d supply, fill #0

## 2022-03-22 MED ORDER — FREESTYLE LANCETS MISC
12 refills | Status: AC
  Filled 2022-03-22: qty 100, 50d supply, fill #0

## 2022-04-06 ENCOUNTER — Encounter: Payer: No Typology Code available for payment source | Admitting: Skilled Nursing Facility1

## 2022-04-15 ENCOUNTER — Encounter: Payer: No Typology Code available for payment source | Admitting: Physician Assistant

## 2022-04-25 ENCOUNTER — Other Ambulatory Visit: Payer: Self-pay | Admitting: Physician Assistant

## 2022-04-25 ENCOUNTER — Other Ambulatory Visit (HOSPITAL_BASED_OUTPATIENT_CLINIC_OR_DEPARTMENT_OTHER): Payer: Self-pay

## 2022-04-25 DIAGNOSIS — K219 Gastro-esophageal reflux disease without esophagitis: Secondary | ICD-10-CM

## 2022-04-25 DIAGNOSIS — E782 Mixed hyperlipidemia: Secondary | ICD-10-CM

## 2022-04-25 MED ORDER — ATORVASTATIN CALCIUM 40 MG PO TABS
ORAL_TABLET | Freq: Every day | ORAL | 0 refills | Status: DC
  Filled 2022-04-25: qty 90, 90d supply, fill #0

## 2022-04-25 MED ORDER — PANTOPRAZOLE SODIUM 40 MG PO TBEC
DELAYED_RELEASE_TABLET | Freq: Every day | ORAL | 0 refills | Status: DC
  Filled 2022-04-25: qty 90, 90d supply, fill #0

## 2022-04-29 ENCOUNTER — Encounter: Payer: No Typology Code available for payment source | Admitting: Physician Assistant

## 2022-05-03 ENCOUNTER — Telehealth: Payer: No Typology Code available for payment source | Admitting: Skilled Nursing Facility1

## 2022-05-20 ENCOUNTER — Encounter: Payer: Self-pay | Admitting: Physician Assistant

## 2022-05-20 ENCOUNTER — Other Ambulatory Visit (HOSPITAL_BASED_OUTPATIENT_CLINIC_OR_DEPARTMENT_OTHER): Payer: Self-pay

## 2022-05-20 ENCOUNTER — Ambulatory Visit (INDEPENDENT_AMBULATORY_CARE_PROVIDER_SITE_OTHER): Payer: No Typology Code available for payment source | Admitting: Physician Assistant

## 2022-05-20 VITALS — BP 107/59 | HR 84 | Ht 60.0 in | Wt 178.0 lb

## 2022-05-20 DIAGNOSIS — N951 Menopausal and female climacteric states: Secondary | ICD-10-CM

## 2022-05-20 DIAGNOSIS — Z1329 Encounter for screening for other suspected endocrine disorder: Secondary | ICD-10-CM

## 2022-05-20 DIAGNOSIS — E782 Mixed hyperlipidemia: Secondary | ICD-10-CM | POA: Diagnosis not present

## 2022-05-20 DIAGNOSIS — D509 Iron deficiency anemia, unspecified: Secondary | ICD-10-CM

## 2022-05-20 DIAGNOSIS — I10 Essential (primary) hypertension: Secondary | ICD-10-CM | POA: Diagnosis not present

## 2022-05-20 DIAGNOSIS — F439 Reaction to severe stress, unspecified: Secondary | ICD-10-CM

## 2022-05-20 DIAGNOSIS — E1165 Type 2 diabetes mellitus with hyperglycemia: Secondary | ICD-10-CM | POA: Diagnosis not present

## 2022-05-20 DIAGNOSIS — Z0000 Encounter for general adult medical examination without abnormal findings: Secondary | ICD-10-CM | POA: Diagnosis not present

## 2022-05-20 DIAGNOSIS — G478 Other sleep disorders: Secondary | ICD-10-CM | POA: Insufficient documentation

## 2022-05-20 DIAGNOSIS — F411 Generalized anxiety disorder: Secondary | ICD-10-CM

## 2022-05-20 DIAGNOSIS — Z6834 Body mass index (BMI) 34.0-34.9, adult: Secondary | ICD-10-CM

## 2022-05-20 DIAGNOSIS — R5383 Other fatigue: Secondary | ICD-10-CM

## 2022-05-20 DIAGNOSIS — R0683 Snoring: Secondary | ICD-10-CM

## 2022-05-20 DIAGNOSIS — E6609 Other obesity due to excess calories: Secondary | ICD-10-CM

## 2022-05-20 DIAGNOSIS — K219 Gastro-esophageal reflux disease without esophagitis: Secondary | ICD-10-CM

## 2022-05-20 MED ORDER — VENLAFAXINE HCL ER 75 MG PO CP24
75.0000 mg | ORAL_CAPSULE | Freq: Every day | ORAL | 3 refills | Status: DC
  Filled 2022-05-20 – 2022-06-13 (×2): qty 90, 90d supply, fill #0

## 2022-05-20 MED ORDER — OZEMPIC (1 MG/DOSE) 4 MG/3ML ~~LOC~~ SOPN
1.0000 mg | PEN_INJECTOR | SUBCUTANEOUS | 0 refills | Status: DC
  Filled 2022-05-20: qty 3, 28d supply, fill #0
  Filled 2022-06-13: qty 9, 84d supply, fill #0

## 2022-05-20 MED ORDER — PANTOPRAZOLE SODIUM 40 MG PO TBEC
40.0000 mg | DELAYED_RELEASE_TABLET | Freq: Every day | ORAL | 3 refills | Status: DC
  Filled 2022-05-20: qty 90, fill #0
  Filled 2022-06-13 – 2022-11-16 (×2): qty 90, 90d supply, fill #0
  Filled 2023-02-09 (×2): qty 90, 90d supply, fill #1

## 2022-05-20 NOTE — Patient Instructions (Addendum)
Will get sleep study.   Health Maintenance, Female Adopting a healthy lifestyle and getting preventive care are important in promoting health and wellness. Ask your health care provider about: The right schedule for you to have regular tests and exams. Things you can do on your own to prevent diseases and keep yourself healthy. What should I know about diet, weight, and exercise? Eat a healthy diet  Eat a diet that includes plenty of vegetables, fruits, low-fat dairy products, and lean protein. Do not eat a lot of foods that are high in solid fats, added sugars, or sodium. Maintain a healthy weight Body mass index (BMI) is used to identify weight problems. It estimates body fat based on height and weight. Your health care provider can help determine your BMI and help you achieve or maintain a healthy weight. Get regular exercise Get regular exercise. This is one of the most important things you can do for your health. Most adults should: Exercise for at least 150 minutes each week. The exercise should increase your heart rate and make you sweat (moderate-intensity exercise). Do strengthening exercises at least twice a week. This is in addition to the moderate-intensity exercise. Spend less time sitting. Even light physical activity can be beneficial. Watch cholesterol and blood lipids Have your blood tested for lipids and cholesterol at 55 years of age, then have this test every 5 years. Have your cholesterol levels checked more often if: Your lipid or cholesterol levels are high. You are older than 54 years of age. You are at high risk for heart disease. What should I know about cancer screening? Depending on your health history and family history, you may need to have cancer screening at various ages. This may include screening for: Breast cancer. Cervical cancer. Colorectal cancer. Skin cancer. Lung cancer. What should I know about heart disease, diabetes, and high blood  pressure? Blood pressure and heart disease High blood pressure causes heart disease and increases the risk of stroke. This is more likely to develop in people who have high blood pressure readings or are overweight. Have your blood pressure checked: Every 3-5 years if you are 28-64 years of age. Every year if you are 16 years old or older. Diabetes Have regular diabetes screenings. This checks your fasting blood sugar level. Have the screening done: Once every three years after age 6 if you are at a normal weight and have a low risk for diabetes. More often and at a younger age if you are overweight or have a high risk for diabetes. What should I know about preventing infection? Hepatitis B If you have a higher risk for hepatitis B, you should be screened for this virus. Talk with your health care provider to find out if you are at risk for hepatitis B infection. Hepatitis C Testing is recommended for: Everyone born from [birth year] through 1964. Anyone with known risk factors for hepatitis C. Sexually transmitted infections (STIs) Get screened for STIs, including gonorrhea and chlamydia, if: You are sexually active and are younger than 55 years of age. You are older than 55 years of age and your health care provider tells you that you are at risk for this type of infection. Your sexual activity has changed since you were last screened, and you are at increased risk for chlamydia or gonorrhea. Ask your health care provider if you are at risk. Ask your health care provider about whether you are at high risk for HIV. Your health care provider may recommend a  prescription medicine to help prevent HIV infection. If you choose to take medicine to prevent HIV, you should first get tested for HIV. You should then be tested every 3 months for as long as you are taking the medicine. Pregnancy If you are about to stop having your period (premenopausal) and you may become pregnant, seek counseling before you  get pregnant. Take 400 to 800 micrograms (mcg) of folic acid every day if you become pregnant. Ask for birth control (contraception) if you want to prevent pregnancy. Osteoporosis and menopause Osteoporosis is a disease in which the bones lose minerals and strength with aging. This can result in bone fractures. If you are 36 years old or older, or if you are at risk for osteoporosis and fractures, ask your health care provider if you should: Be screened for bone loss. Take a calcium or vitamin D supplement to lower your risk of fractures. Be given hormone replacement therapy (HRT) to treat symptoms of menopause. Follow these instructions at home: Alcohol use Do not drink alcohol if: Your health care provider tells you not to drink. You are pregnant, may be pregnant, or are planning to become pregnant. If you drink alcohol: Limit how much you have to: 0-1 drink a day. Know how much alcohol is in your drink. In the [place] one drink equals one 12 oz bottle of beer (355 mL), one 5 oz glass of wine (148 mL), or one 1 oz glass of hard liquor (44 mL). Lifestyle Do not use any products that contain nicotine or tobacco. These products include cigarettes, chewing tobacco, and vaping devices, such as e-cigarettes. If you need help quitting, ask your health care provider. Do not use street drugs. Do not share needles. Ask your health care provider for help if you need support or information about quitting drugs. General instructions Schedule regular health, dental, and eye exams. Stay current with your vaccines. Tell your health care provider if: You often feel depressed. You have ever been abused or do not feel safe at home. Summary Adopting a healthy lifestyle and getting preventive care are important in promoting health and wellness. Follow your health care provider's instructions about healthy diet, exercising, and getting tested or screened for diseases. Follow your health care provider's  instructions on monitoring your cholesterol and blood pressure. This information is not intended to replace advice given to you by your health care provider. Make sure you discuss any questions you have with your health care provider. Document Revised: 01/04/2021 Document Reviewed: 01/04/2021 Elsevier Patient Education  [place].

## 2022-05-20 NOTE — Progress Notes (Signed)
Complete physical exam  Patient: Kristen Bright   DOB: 07/18/1966   56 y.o. Female  MRN: [id number]  Subjective:    Chief Complaint  Patient presents with  . Annual Exam    Kristen Bright is a 56 y.o. female who presents today for a complete physical exam. She reports consuming a {diet types:17450} diet. {types:19826} She generally feels {DESC; WELL/FAIRLY WELL/POORLY:18703}. She reports sleeping {DESC; WELL/FAIRLY WELL/POORLY:18703}. She {does/does not:200015} have additional problems to discuss today.    Most recent fall risk assessment:    05/20/2022    1:41 PM  [place] in the past year? 0  Number falls in past yr: 0  Injury with Fall? 0  Risk for fall due to : No Fall Risks  Follow up Falls evaluation completed     Most recent depression screenings:    05/20/2022    1:41 PM 03/02/2022   10:23 AM  PHQ 2/9 Scores  PHQ - 2 Score 0 0    {VISON DENTAL STD PSA (Optional):27386}  {History (Optional):23778}  Patient Care Team: Lavada Mesi as PCP - General (Family Medicine) Molli Posey, MD as PCP - OBGYN (Obstetrics and Gynecology)   Outpatient Medications Prior to Visit  Medication Sig  . atorvastatin (LIPITOR) 40 MG tablet TAKE 1 TABLET BY MOUTH ONCE DAILY  . Blood Glucose Monitoring Suppl (FREESTYLE LITE) w/Device KIT Check fasting blood sugar every morning and 2 hours after largest meal of the day.  Marland Kitchen glucose blood (FREESTYLE LITE) test strip Check fasting blood sugar every morning and 2 hours after largest meal of the day.  . Lancets (FREESTYLE) lancets Use to stick skin for testing. Check fasting blood sugar every morning and 2 hours after largest meal of the day.  . Multiple Vitamin (MULTIVITAMIN) capsule Take 1 capsule by mouth daily.  Marland Kitchen olmesartan-hydrochlorothiazide (BENICAR HCT) 40-25 MG tablet TAKE 1 TABLET BY MOUTH ONCE DAILY  . pantoprazole (PROTONIX) 40 MG tablet TAKE 1 TABLET BY MOUTH ONCE DAILY  . Semaglutide, 1 MG/DOSE,  (OZEMPIC, 1 MG/DOSE,) 4 MG/3ML SOPN Inject 1 mg into the skin once a week. Needs appt  . venlafaxine XR (EFFEXOR XR) 37.5 MG 24 hr capsule Take 1 capsule (37.5 mg total) by mouth daily with breakfast. Needs appt   No facility-administered medications prior to visit.    ROS        Objective:     BP (!) 107/59   Pulse 84   Ht 5' (1.524 m)   Wt 178 lb (80.7 kg)   SpO2 98%   BMI 34.76 kg/m  BP Readings from Last 3 Encounters:  05/20/22 (!) 107/59  12/14/21 110/65  10/05/21 124/66   Wt Readings from Last 3 Encounters:  05/20/22 178 lb (80.7 kg)  12/14/21 181 lb (82.1 kg)  10/05/21 182 lb 14.4 oz (83 kg)      Physical Exam   No results found for any visits on 05/20/22. {Show previous labs (optional):23779}    Assessment & Plan:    Routine Health Maintenance and Physical Exam  Immunization History  Administered Date(s) Administered  . Influenza Split 06/29/2012  . Influenza Whole 05/29/2010  . Influenza,inj,Quad PF,6+ Mos 05/27/2014, 05/30/2015  . Influenza-Unspecified 06/06/2016, 05/31/2017, 05/29/2018, 06/03/2020  . PFIZER(Purple Top)SARS-COV-2 Vaccination 09/03/2019, 09/24/2019, 07/28/2020, 09/29/2020  . Td 08/29/2009  . Tdap 02/16/2021    Health Maintenance  Topic Date Due  . HIV Screening  Never done  . Diabetic kidney evaluation - Urine ACR  Never done  . OPHTHALMOLOGY EXAM  05/20/2022 (Originally 11/10/2021)  . COVID-19 Vaccine (5 - Pfizer risk series) 06/05/2022 (Originally 11/24/2020)  . Zoster Vaccines- Shingrix (1 of 2) 08/19/2022 (Originally 02/03/1986)  . INFLUENZA VACCINE  11/27/2022 (Originally 03/29/2022)  . HEMOGLOBIN A1C  06/15/2022  . Diabetic kidney evaluation - GFR measurement  12/15/2022  . MAMMOGRAM  07/15/2023  . PAP SMEAR-Modifier  10/05/2024  . COLONOSCOPY (Pts 45-83yr Insurance coverage will need to be confirmed)  07/02/2028  . TETANUS/TDAP  02/17/2031  . Hepatitis C Screening  Completed  . HPV VACCINES  Aged Out  . FOOT EXAM   Discontinued    Discussed health benefits of physical activity, and encouraged her to engage in regular exercise appropriate for her age and condition.  Problem List Items Addressed This Visit       Unprioritized   Essential hypertension, benign   Relevant Orders   COMPLETE METABOLIC PANEL WITH GFR   Mixed hyperlipidemia   Relevant Orders   Lipid Panel w/reflex Direct LDL   Type 2 diabetes mellitus with hyperglycemia, without long-term current use of insulin (HCC) - Primary   Relevant Orders   COMPLETE METABOLIC PANEL WITH GFR   Hemoglobin A1c   Urine Microalbumin w/creat. ratio   Other Visit Diagnoses     Routine physical examination       Relevant Orders   TSH   Lipid Panel w/reflex Direct LDL   COMPLETE METABOLIC PANEL WITH GFR   CBC with Differential/Platelet   Hemoglobin A1c   Iron deficiency anemia, unspecified iron deficiency anemia type       Relevant Orders   CBC with Differential/Platelet   Thyroid disorder screen       Relevant Orders   TSH      No follow-ups on file.     JIran Planas PA-C

## 2022-05-30 ENCOUNTER — Other Ambulatory Visit (HOSPITAL_BASED_OUTPATIENT_CLINIC_OR_DEPARTMENT_OTHER): Payer: Self-pay

## 2022-06-04 LAB — COMPLETE METABOLIC PANEL WITHOUT GFR
AG Ratio: 1.6 (calc) (ref 1.0–2.5)
ALT: 21 U/L (ref 6–29)
AST: 17 U/L (ref 10–35)
Albumin: 4.4 g/dL (ref 3.6–5.1)
Alkaline phosphatase (APISO): 66 U/L (ref 37–153)
BUN: 16 mg/dL (ref 7–25)
CO2: 30 mmol/L (ref 20–32)
Calcium: 9.8 mg/dL (ref 8.6–10.4)
Chloride: 101 mmol/L (ref 98–110)
Creat: 0.85 mg/dL (ref 0.50–1.03)
Globulin: 2.8 g/dL (ref 1.9–3.7)
Glucose, Bld: 108 mg/dL — ABNORMAL HIGH (ref 65–99)
Potassium: 3.8 mmol/L (ref 3.5–5.3)
Sodium: 140 mmol/L (ref 135–146)
Total Bilirubin: 0.7 mg/dL (ref 0.2–1.2)
Total Protein: 7.2 g/dL (ref 6.1–8.1)
eGFR: 81 mL/min/1.73m2

## 2022-06-04 LAB — LIPID PANEL W/REFLEX DIRECT LDL
Cholesterol: 158 mg/dL (ref <200)
HDL: 52 mg/dL (ref >=50)
LDL Cholesterol (Calc): 84 mg/dL (calc)
Non-HDL Cholesterol (Calc): 106 mg/dL (calc) (ref <130)
Total CHOL/HDL Ratio: 3 (calc) (ref <5.0)
Triglycerides: 121 mg/dL (ref <150)

## 2022-06-04 LAB — CBC WITH DIFFERENTIAL/PLATELET
Absolute Monocytes: 431 cells/uL (ref 200–950)
Basophils Absolute: 18 cells/uL (ref 0–200)
Basophils Relative: 0.3 %
Eosinophils Absolute: 71 cells/uL (ref 15–500)
Eosinophils Relative: 1.2 %
HCT: 38.2 % (ref 35.0–45.0)
Hemoglobin: 13.2 g/dL (ref 11.7–15.5)
Lymphs Abs: 2130 cells/uL (ref 850–3900)
MCH: 30.6 pg (ref 27.0–33.0)
MCHC: 34.6 g/dL (ref 32.0–36.0)
MCV: 88.4 fL (ref 80.0–100.0)
MPV: 10.2 fL (ref 7.5–12.5)
Monocytes Relative: 7.3 %
Neutro Abs: 3251 cells/uL (ref 1500–7800)
Neutrophils Relative %: 55.1 %
Platelets: 320 10*3/uL (ref 140–400)
RBC: 4.32 10*6/uL (ref 3.80–5.10)
RDW: 13.1 % (ref 11.0–15.0)
Total Lymphocyte: 36.1 %
WBC: 5.9 10*3/uL (ref 3.8–10.8)

## 2022-06-04 LAB — B12 AND FOLATE PANEL
Folate: >24 ng/mL
Vitamin B-12: 675 pg/mL (ref 200–1100)

## 2022-06-04 LAB — VITAMIN D 25 HYDROXY (VIT D DEFICIENCY, FRACTURES): Vit D, 25-Hydroxy: 46 ng/mL (ref 30–100)

## 2022-06-04 LAB — MICROALBUMIN / CREATININE URINE RATIO
Creatinine, Urine: 346 mg/dL — ABNORMAL HIGH (ref 20–275)
Microalb Creat Ratio: 4 ug/mg{creat}
Microalb, Ur: 1.4 mg/dL

## 2022-06-04 LAB — HEMOGLOBIN A1C
Hgb A1c MFr Bld: 6 % of total Hgb — ABNORMAL HIGH (ref <5.7)
Mean Plasma Glucose: 126 mg/dL
eAG (mmol/L): 7 mmol/L

## 2022-06-04 LAB — TSH: TSH: 2.05 mIU/L

## 2022-06-06 NOTE — Progress Notes (Signed)
A1C is 6.0. stable from 5 months ago and in goal range.  Thyroid looks great.  B12 looks great.  Vitamin D normal range.  Hemoglobin and WBC look good.  Cholesterol looks good.   You do have protein in your urine. Your kidney filtration rate looks good. Spilling protein increases your chances of having future kidney problems. I would like for you to consider adding a medication that will help protect your kidney function called farxiga. Thoughts?

## 2022-06-10 ENCOUNTER — Encounter: Payer: Self-pay | Admitting: Physician Assistant

## 2022-06-13 ENCOUNTER — Other Ambulatory Visit: Payer: Self-pay | Admitting: Physician Assistant

## 2022-06-13 ENCOUNTER — Other Ambulatory Visit (HOSPITAL_BASED_OUTPATIENT_CLINIC_OR_DEPARTMENT_OTHER): Payer: Self-pay

## 2022-06-13 DIAGNOSIS — E782 Mixed hyperlipidemia: Secondary | ICD-10-CM

## 2022-06-13 MED ORDER — ATORVASTATIN CALCIUM 40 MG PO TABS
40.0000 mg | ORAL_TABLET | Freq: Every day | ORAL | 0 refills | Status: DC
  Filled 2022-06-13 – 2022-08-10 (×2): qty 90, 90d supply, fill #0

## 2022-06-13 MED ORDER — DAPAGLIFLOZIN PROPANEDIOL 10 MG PO TABS
10.0000 mg | ORAL_TABLET | Freq: Every day | ORAL | 0 refills | Status: DC
  Filled 2022-06-13: qty 90, 90d supply, fill #0

## 2022-06-14 ENCOUNTER — Other Ambulatory Visit (HOSPITAL_BASED_OUTPATIENT_CLINIC_OR_DEPARTMENT_OTHER): Payer: Self-pay

## 2022-07-13 ENCOUNTER — Ambulatory Visit (HOSPITAL_BASED_OUTPATIENT_CLINIC_OR_DEPARTMENT_OTHER): Payer: No Typology Code available for payment source | Attending: Physician Assistant | Admitting: Internal Medicine

## 2022-07-13 VITALS — Ht 60.0 in | Wt 175.0 lb

## 2022-07-13 DIAGNOSIS — Z6834 Body mass index (BMI) 34.0-34.9, adult: Secondary | ICD-10-CM | POA: Insufficient documentation

## 2022-07-13 DIAGNOSIS — R5383 Other fatigue: Secondary | ICD-10-CM | POA: Insufficient documentation

## 2022-07-13 DIAGNOSIS — G478 Other sleep disorders: Secondary | ICD-10-CM | POA: Insufficient documentation

## 2022-07-13 DIAGNOSIS — G4733 Obstructive sleep apnea (adult) (pediatric): Secondary | ICD-10-CM | POA: Diagnosis not present

## 2022-07-13 DIAGNOSIS — E6609 Other obesity due to excess calories: Secondary | ICD-10-CM | POA: Diagnosis not present

## 2022-07-13 DIAGNOSIS — R0683 Snoring: Secondary | ICD-10-CM | POA: Insufficient documentation

## 2022-07-24 DIAGNOSIS — E6609 Other obesity due to excess calories: Secondary | ICD-10-CM

## 2022-07-24 DIAGNOSIS — R5383 Other fatigue: Secondary | ICD-10-CM | POA: Diagnosis not present

## 2022-07-24 DIAGNOSIS — Z6834 Body mass index (BMI) 34.0-34.9, adult: Secondary | ICD-10-CM

## 2022-07-24 DIAGNOSIS — G478 Other sleep disorders: Secondary | ICD-10-CM

## 2022-07-24 DIAGNOSIS — R0683 Snoring: Secondary | ICD-10-CM | POA: Diagnosis not present

## 2022-07-24 NOTE — Procedures (Signed)
     Patient Name: Kristen Bright, Kristen Bright Date: 07/14/2022 Gender: Female D.O.B: 1966-01-07 Age (years): 56 Referring Provider: Iran Planas Height (inches): 60 Interpreting Physician: Baird Lyons MD, ABSM Weight (lbs): 175 RPSGT: Gerhard Perches BMI: 34 MRN: [id number] Neck Size: 14.50  CLINICAL INFORMATION Sleep Study Type: HST Indication for sleep study: somnolence Epworth Sleepiness Score: 9  SLEEP STUDY TECHNIQUE A multi-channel overnight portable sleep study was performed. The channels recorded were: nasal airflow, thoracic respiratory movement, and oxygen saturation with a pulse oximetry. Snoring was also monitored.  MEDICATIONS Patient self administered medications include: none reported.  SLEEP ARCHITECTURE Patient was studied for 404.9 minutes. The sleep efficiency was 100.0 % and the patient was supine for 0%. The arousal index was 0.0 per hour.  RESPIRATORY PARAMETERS The overall AHI was 34.8 per hour, with a central apnea index of 0 per hour. The oxygen nadir was 72% during sleep.  CARDIAC DATA Mean heart rate during sleep was 75.0 bpm.  IMPRESSIONS - Severe obstructive sleep apnea occurred during this study (AHI = 34.8/h). - Oxygen desaturation was noted during this study (Min O2 = 72%). Mean 98.4% - Patient snored.  DIAGNOSIS - Obstructive Sleep Apnea (G47.33) - Nocturnal Hypoxemia (G47.36)  RECOMMENDATIONS - Suggest CPAP titration sleep study or autopap. Other options would be based on clinical judgment. - Be careful with alcohol, sedatives and other CNS depressants that may worsen sleep apnea and disrupt normal sleep architecture. - Sleep hygiene should be reviewed to assess factors that may improve sleep quality. - Weight management and regular exercise should be initiated or continued.  [Electronically signed] 07/24/2022 04:23 PM  Baird Lyons MD, [place], American Board of Sleep Medicine NPI: [id number]                         [place], [facility]  ELECTRONICALLY SIGNED ON:  07/24/2022, 4:20 PM [place] PH: [phone]   FX: [phone] [place]

## 2022-07-25 ENCOUNTER — Telehealth: Payer: Self-pay | Admitting: Neurology

## 2022-07-25 ENCOUNTER — Encounter: Payer: Self-pay | Admitting: Physician Assistant

## 2022-07-25 DIAGNOSIS — G4733 Obstructive sleep apnea (adult) (pediatric): Secondary | ICD-10-CM

## 2022-07-25 NOTE — Telephone Encounter (Signed)
Patient made aware. Message sent to [facility] to get patient set up for CPAP.

## 2022-07-25 NOTE — Telephone Encounter (Signed)
-----   Message from Donella Stade, PA-C sent at 07/25/2022 10:30 AM EST ----- Pt has severe sleep apnea and needs CPAP. We can do autopap. Let pt know.  ----- Message ----- From: Deneise Lever, MD Sent: 07/24/2022   4:25 PM EST To: Donella Stade, PA-C

## 2022-08-09 ENCOUNTER — Ambulatory Visit: Payer: No Typology Code available for payment source | Admitting: Dermatology

## 2022-08-10 ENCOUNTER — Other Ambulatory Visit (HOSPITAL_BASED_OUTPATIENT_CLINIC_OR_DEPARTMENT_OTHER): Payer: Self-pay

## 2022-08-11 LAB — HM DIABETES EYE EXAM

## 2022-08-31 ENCOUNTER — Other Ambulatory Visit: Payer: Self-pay | Admitting: Physician Assistant

## 2022-08-31 ENCOUNTER — Other Ambulatory Visit (HOSPITAL_BASED_OUTPATIENT_CLINIC_OR_DEPARTMENT_OTHER): Payer: Self-pay

## 2022-08-31 DIAGNOSIS — E782 Mixed hyperlipidemia: Secondary | ICD-10-CM

## 2022-08-31 MED ORDER — ATORVASTATIN CALCIUM 40 MG PO TABS
40.0000 mg | ORAL_TABLET | Freq: Every day | ORAL | 2 refills | Status: DC
  Filled 2022-08-31 – 2022-11-11 (×2): qty 90, 90d supply, fill #0

## 2022-09-02 ENCOUNTER — Ambulatory Visit: Payer: No Typology Code available for payment source | Admitting: Physician Assistant

## 2022-09-09 ENCOUNTER — Other Ambulatory Visit (HOSPITAL_BASED_OUTPATIENT_CLINIC_OR_DEPARTMENT_OTHER): Payer: Self-pay

## 2022-09-09 ENCOUNTER — Ambulatory Visit (INDEPENDENT_AMBULATORY_CARE_PROVIDER_SITE_OTHER): Payer: 59

## 2022-09-09 ENCOUNTER — Ambulatory Visit (INDEPENDENT_AMBULATORY_CARE_PROVIDER_SITE_OTHER): Payer: 59 | Admitting: Physician Assistant

## 2022-09-09 ENCOUNTER — Other Ambulatory Visit: Payer: Self-pay | Admitting: Neurology

## 2022-09-09 VITALS — Temp 98.7°F

## 2022-09-09 DIAGNOSIS — R519 Headache, unspecified: Secondary | ICD-10-CM

## 2022-09-09 DIAGNOSIS — H65191 Other acute nonsuppurative otitis media, right ear: Secondary | ICD-10-CM

## 2022-09-09 DIAGNOSIS — R42 Dizziness and giddiness: Secondary | ICD-10-CM

## 2022-09-09 MED ORDER — FLUTICASONE PROPIONATE 50 MCG/ACT NA SUSP: 2.0000 | Freq: Every day | NASAL | 0 refills | Status: DC

## 2022-09-09 MED ORDER — METHYLPREDNISOLONE SODIUM SUCC 125 MG IJ SOLR
125.0000 mg | Freq: Once | INTRAMUSCULAR | Status: AC
  Administered 2022-09-09: 125 mg via INTRAMUSCULAR

## 2022-09-09 MED ORDER — AMOXICILLIN-POT CLAVULANATE 875-125 MG PO TABS
1.0000 | ORAL_TABLET | Freq: Two times a day (BID) | ORAL | 0 refills | Status: DC
  Filled 2022-09-09: qty 20, 10d supply, fill #0

## 2022-09-09 NOTE — Progress Notes (Signed)
CT is normal. No acute findings. Sinuses look clear as well. I would hold off on starting the augmentin. Lets see if you get any relief from shot and ok to start flonase 2 sprays each nostril daily if you tolerate it.

## 2022-09-09 NOTE — Patient Instructions (Signed)
Ordered CT Start augmentin

## 2022-09-12 ENCOUNTER — Encounter: Payer: Self-pay | Admitting: Physician Assistant

## 2022-09-12 NOTE — Progress Notes (Signed)
Acute Office Visit  Subjective:     Patient ID: Kristen Bright, female    DOB: [birth year], 57 y.o.   MRN: [id number]  Chief Complaint  Patient presents with   Dizziness    HPI Patient is in today for dizziness and headache for the last week. She is concerned because it is Friday and "she does not feel right". She denies any fever, chills, cough, SOB. She has not been sick. She does have some head congestion but not uncommon for this time of year. She works in [employer] and they tried some vestibular rehab but did not induce dizziness and did not help. No falls but she "just feels off". She feels like she cannot concentrate and has brain fog.   .. Active Ambulatory Problems    Diagnosis Date Noted   ELEVATED BLOOD PRESSURE WITHOUT DIAGNOSIS OF HYPERTENSION 11/09/2010   Severe sleep apnea 09/30/2013   Heart murmur 06/24/2014   Fatty liver disease, nonalcoholic 36/46/8032   Class 2 severe obesity due to excess calories with serious comorbidity and body mass index (BMI) of 35.0 to 35.9 in adult ([place]) 11/04/2014   Weight gain 11/04/2014   Stress 09/14/2015   GAD (generalized anxiety disorder) 09/14/2015   Essential hypertension, benign 09/14/2015   DDD (degenerative disc disease), cervical 08/18/2016   Mixed hyperlipidemia 01/18/2017   Memory changes 11/19/2017   Type 2 diabetes mellitus with hyperglycemia, without long-term current use of insulin ([place]) 06/25/2019   Low serum potassium 06/26/2019   Gastroesophageal reflux disease 06/26/2019   History of COVID-19 09/23/2019   Throat congestion 02/16/2021   Actinic keratosis 10/05/2021   Menopausal symptoms 12/14/2021   Abdominal bloating 12/14/2021   Non-restorative sleep 05/20/2022   Resolved Ambulatory Problems    Diagnosis Date Noted   ANXIETY 02/28/2010   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      Objective:    Temp 98.7 F (37.1 C) (Oral)   SpO2 100%  BP Readings from Last 3 Encounters:  05/20/22 (!) 107/59  12/14/21 110/65  10/05/21 124/66   Wt Readings from Last 3 Encounters:  07/13/22 175 lb (79.4 kg)  05/20/22 178 lb (80.7 kg)  12/14/21 181 lb (82.1 kg)   Orthostatic Vitals:  BP great and stable at 120s over 70s HR increased by 20bpm from laying to sitting   Physical Exam Constitutional:      Appearance: Normal appearance. She is obese.  HENT:     Head: Normocephalic.     Right Ear: Ear canal and external ear normal. There is no impacted cerumen.     Left Ear: Tympanic membrane, ear canal and external ear normal. There is no impacted cerumen.     Ears:     Comments: Right middle ear effusion with no erythema    Nose: Congestion present.     Mouth/Throat:     Mouth: Mucous membranes are moist.     Pharynx: No oropharyngeal exudate or posterior oropharyngeal erythema.  Eyes:     General:        Right eye: No discharge.  Left eye: No discharge.     Extraocular Movements: Extraocular movements intact.     Conjunctiva/sclera: Conjunctivae normal.     Pupils: Pupils are equal, round, and reactive to light.  Neck:     Vascular: No carotid bruit.  Cardiovascular:     Rate and Rhythm: Normal rate and regular rhythm.  Pulmonary:     Effort: Pulmonary effort is normal.     Breath sounds: Normal breath sounds.  Musculoskeletal:     Cervical back: Neck supple. No tenderness.     Right lower leg: No edema.     Left lower leg: No edema.  Lymphadenopathy:     Cervical: No cervical adenopathy.  Neurological:     General: No focal deficit present.     Mental Status: She is alert and oriented to person, place, and time.     Cranial  Nerves: No cranial nerve deficit.     Sensory: No sensory deficit.     Motor: No weakness.     Coordination: Coordination normal.     Gait: Gait normal.     Deep Tendon Reflexes: Reflexes normal.     Comments: Negative [place]pike.   Psychiatric:        Mood and Affect: Mood normal.         Assessment & Plan:  Marland KitchenMarland KitchenKirandeep was seen today for dizziness.  Diagnoses and all orders for this visit:  Dizziness -     CT HEAD WO CONTRAST (5MM); Future -     methylPREDNISolone sodium succinate (SOLU-MEDROL) 125 mg/2 mL injection 125 mg  Acute MEE (middle ear effusion), right -     CT HEAD WO CONTRAST (5MM); Future -     amoxicillin-clavulanate (AUGMENTIN) 875-125 MG tablet; Take 1 tablet by mouth 2 (two) times daily. -     methylPREDNISolone sodium succinate (SOLU-MEDROL) 125 mg/2 mL injection 125 mg  Acute nonintractable headache, unspecified headache type -     CT HEAD WO CONTRAST (5MM); Future -     amoxicillin-clavulanate (AUGMENTIN) 875-125 MG tablet; Take 1 tablet by mouth 2 (two) times daily. -     methylPREDNISolone sodium succinate (SOLU-MEDROL) 125 mg/2 mL injection 125 mg   No acute findings causing etiology No red flag neuro symptoms on exam today ? If sinusitis causing some right ear effusion and some dizziness Solumedrol IM given today in office Due to 5 days of dizziness and headache a CT of head was ordered Augmentin sent to pharmacy for sinusitis Follow up as needed or any new or worsening symptoms   Iran Planas, PA-C

## 2022-09-18 DIAGNOSIS — G4733 Obstructive sleep apnea (adult) (pediatric): Secondary | ICD-10-CM | POA: Diagnosis not present

## 2022-09-21 DIAGNOSIS — M9906 Segmental and somatic dysfunction of lower extremity: Secondary | ICD-10-CM | POA: Diagnosis not present

## 2022-09-21 DIAGNOSIS — M545 Low back pain, unspecified: Secondary | ICD-10-CM | POA: Diagnosis not present

## 2022-09-21 DIAGNOSIS — M7918 Myalgia, other site: Secondary | ICD-10-CM | POA: Diagnosis not present

## 2022-09-21 DIAGNOSIS — M9901 Segmental and somatic dysfunction of cervical region: Secondary | ICD-10-CM | POA: Diagnosis not present

## 2022-09-21 DIAGNOSIS — M9902 Segmental and somatic dysfunction of thoracic region: Secondary | ICD-10-CM | POA: Diagnosis not present

## 2022-09-21 DIAGNOSIS — M25651 Stiffness of right hip, not elsewhere classified: Secondary | ICD-10-CM | POA: Diagnosis not present

## 2022-09-21 DIAGNOSIS — M9905 Segmental and somatic dysfunction of pelvic region: Secondary | ICD-10-CM | POA: Diagnosis not present

## 2022-09-21 DIAGNOSIS — M546 Pain in thoracic spine: Secondary | ICD-10-CM | POA: Diagnosis not present

## 2022-09-21 DIAGNOSIS — M542 Cervicalgia: Secondary | ICD-10-CM | POA: Diagnosis not present

## 2022-09-21 DIAGNOSIS — M9903 Segmental and somatic dysfunction of lumbar region: Secondary | ICD-10-CM | POA: Diagnosis not present

## 2022-09-22 ENCOUNTER — Encounter: Payer: Self-pay | Admitting: Physician Assistant

## 2022-10-06 ENCOUNTER — Other Ambulatory Visit: Payer: Self-pay | Admitting: Physician Assistant

## 2022-10-07 ENCOUNTER — Ambulatory Visit: Payer: 59 | Admitting: Physician Assistant

## 2022-10-07 ENCOUNTER — Other Ambulatory Visit (HOSPITAL_BASED_OUTPATIENT_CLINIC_OR_DEPARTMENT_OTHER): Payer: Self-pay

## 2022-10-07 MED ORDER — DAPAGLIFLOZIN PROPANEDIOL 10 MG PO TABS
10.0000 mg | ORAL_TABLET | Freq: Every day | ORAL | 0 refills | Status: DC
  Filled 2022-10-07: qty 90, 90d supply, fill #0

## 2022-10-19 ENCOUNTER — Ambulatory Visit (INDEPENDENT_AMBULATORY_CARE_PROVIDER_SITE_OTHER): Payer: 59 | Admitting: Physician Assistant

## 2022-10-19 ENCOUNTER — Encounter: Payer: Self-pay | Admitting: Physician Assistant

## 2022-10-19 ENCOUNTER — Other Ambulatory Visit (HOSPITAL_BASED_OUTPATIENT_CLINIC_OR_DEPARTMENT_OTHER): Payer: Self-pay

## 2022-10-19 VITALS — BP 122/65 | HR 64 | Ht 60.0 in | Wt 179.1 lb

## 2022-10-19 DIAGNOSIS — F439 Reaction to severe stress, unspecified: Secondary | ICD-10-CM

## 2022-10-19 DIAGNOSIS — F411 Generalized anxiety disorder: Secondary | ICD-10-CM | POA: Diagnosis not present

## 2022-10-19 DIAGNOSIS — E1165 Type 2 diabetes mellitus with hyperglycemia: Secondary | ICD-10-CM | POA: Diagnosis not present

## 2022-10-19 DIAGNOSIS — I10 Essential (primary) hypertension: Secondary | ICD-10-CM

## 2022-10-19 DIAGNOSIS — G4733 Obstructive sleep apnea (adult) (pediatric): Secondary | ICD-10-CM | POA: Diagnosis not present

## 2022-10-19 DIAGNOSIS — N951 Menopausal and female climacteric states: Secondary | ICD-10-CM

## 2022-10-19 LAB — POCT GLYCOSYLATED HEMOGLOBIN (HGB A1C): Hemoglobin A1C: 6.4 % — AB (ref 4.0–5.6)

## 2022-10-19 MED ORDER — OZEMPIC (1 MG/DOSE) 4 MG/3ML ~~LOC~~ SOPN
1.0000 mg | PEN_INJECTOR | SUBCUTANEOUS | 0 refills | Status: DC
  Filled 2022-10-19: qty 3, 28d supply, fill #0
  Filled 2022-11-16: qty 3, 28d supply, fill #1
  Filled 2022-12-26 (×2): qty 3, 28d supply, fill #2

## 2022-10-19 MED ORDER — VENLAFAXINE HCL ER 75 MG PO CP24
75.0000 mg | ORAL_CAPSULE | Freq: Every day | ORAL | 3 refills | Status: DC
  Filled 2022-10-19: qty 90, 90d supply, fill #0
  Filled 2023-01-17: qty 90, 90d supply, fill #1
  Filled 2023-04-17: qty 90, 90d supply, fill #2
  Filled 2023-09-05: qty 90, 90d supply, fill #3

## 2022-10-19 MED ORDER — DAPAGLIFLOZIN PROPANEDIOL 10 MG PO TABS
10.0000 mg | ORAL_TABLET | Freq: Every day | ORAL | 0 refills | Status: DC
  Filled 2022-10-19 – 2023-01-17 (×3): qty 90, 90d supply, fill #0

## 2022-10-19 MED ORDER — OLMESARTAN MEDOXOMIL-HCTZ 40-25 MG PO TABS
1.0000 | ORAL_TABLET | Freq: Every day | ORAL | 3 refills | Status: DC
  Filled 2022-10-19 – 2022-11-11 (×2): qty 90, 90d supply, fill #0
  Filled 2023-02-09 (×2): qty 90, 90d supply, fill #1
  Filled 2023-06-15: qty 90, 90d supply, fill #2
  Filled 2023-09-05: qty 90, 90d supply, fill #3

## 2022-10-19 NOTE — Progress Notes (Unsigned)
   Established Patient Office Visit  Subjective   Patient ID: Kristen Bright, female    DOB: [birth year]  Age: 57 y.o. MRN: [id number]  No chief complaint on file.   HPI OsA autopap 8 hours most nights  .Marland Kitchen Lab Results  Component Value Date   HGBA1C 6.0 (H) 06/03/2022     6.4  Patient Active Problem List   Diagnosis Date Noted   Non-restorative sleep 05/20/2022   Menopausal symptoms 12/14/2021   Abdominal bloating 12/14/2021   Actinic keratosis 10/05/2021   Throat congestion 02/16/2021   History of COVID-19 09/23/2019   Low serum potassium 06/26/2019   Gastroesophageal reflux disease 06/26/2019   Type 2 diabetes mellitus with hyperglycemia, without long-term current use of insulin ([place]) 06/25/2019   Memory changes 11/19/2017   Mixed hyperlipidemia 01/18/2017   DDD (degenerative disc disease), cervical 08/18/2016   Stress 09/14/2015   GAD (generalized anxiety disorder) 09/14/2015   Essential hypertension, benign 09/14/2015   Class 2 severe obesity due to excess calories with serious comorbidity and body mass index (BMI) of 35.0 to 35.9 in adult ([place]) 11/04/2014   Weight gain 11/04/2014   Fatty liver disease, nonalcoholic [id number]   Heart murmur 06/24/2014   OSA (obstructive sleep apnea) 09/30/2013   ELEVATED BLOOD PRESSURE WITHOUT DIAGNOSIS OF HYPERTENSION 11/09/2010   Past Medical History:  Diagnosis Date   Anxiety    Diabetes mellitus without complication ([place])    Hyperlipidemia    Hypertension    Past Surgical History:  Procedure Laterality Date   CESAREAN SECTION     CHOLECYSTECTOMY     Family History  Problem Relation Age of Onset   Diabetes Other        fam hx   Hyperlipidemia Other        fam hx   Hypertension Other        fam hx   Heart disease Other        fam hx   Heart attack Father    Hyperlipidemia Father    Hypertension Father    Allergies  Allergen Reactions   Lisinopril     Swelling  Per patient history     ROS     Objective:     There were no vitals taken for this visit. BP Readings from Last 3 Encounters:  10/19/22 122/65  05/20/22 (!) 107/59  12/14/21 110/65   Wt Readings from Last 3 Encounters:  10/19/22 179 lb 1.3 oz (81.2 kg)  07/13/22 175 lb (79.4 kg)  05/20/22 178 lb (80.7 kg)    .Marland Kitchen Results for orders placed or performed in visit on 10/19/22  POCT glycosylated hemoglobin (Hb A1C)  Result Value Ref Range   Hemoglobin A1C 6.4 (A) 4.0 - 5.6 %   HbA1c POC (<> result, manual entry)     HbA1c, POC (prediabetic range)     HbA1c, POC (controlled diabetic range)       Physical Exam      Assessment & Plan:   Problem List Items Addressed This Visit       Unprioritized   Type 2 diabetes mellitus with hyperglycemia, without long-term current use of insulin (HCC) - Primary   Relevant Orders   POCT glycosylated hemoglobin (Hb A1C)    No follow-ups on file.    Iran Planas, PA-C

## 2022-10-20 ENCOUNTER — Encounter: Payer: Self-pay | Admitting: Physician Assistant

## 2022-10-20 ENCOUNTER — Other Ambulatory Visit (HOSPITAL_BASED_OUTPATIENT_CLINIC_OR_DEPARTMENT_OTHER): Payer: Self-pay

## 2022-11-11 ENCOUNTER — Other Ambulatory Visit (HOSPITAL_BASED_OUTPATIENT_CLINIC_OR_DEPARTMENT_OTHER): Payer: Self-pay

## 2022-11-16 ENCOUNTER — Other Ambulatory Visit (HOSPITAL_BASED_OUTPATIENT_CLINIC_OR_DEPARTMENT_OTHER): Payer: Self-pay

## 2022-11-17 DIAGNOSIS — G4733 Obstructive sleep apnea (adult) (pediatric): Secondary | ICD-10-CM | POA: Diagnosis not present

## 2022-11-25 DIAGNOSIS — G4733 Obstructive sleep apnea (adult) (pediatric): Secondary | ICD-10-CM | POA: Diagnosis not present

## 2022-12-18 DIAGNOSIS — G4733 Obstructive sleep apnea (adult) (pediatric): Secondary | ICD-10-CM | POA: Diagnosis not present

## 2022-12-26 ENCOUNTER — Other Ambulatory Visit (HOSPITAL_BASED_OUTPATIENT_CLINIC_OR_DEPARTMENT_OTHER): Payer: Self-pay

## 2022-12-26 ENCOUNTER — Other Ambulatory Visit (HOSPITAL_COMMUNITY): Payer: Self-pay

## 2022-12-26 DIAGNOSIS — G4733 Obstructive sleep apnea (adult) (pediatric): Secondary | ICD-10-CM | POA: Diagnosis not present

## 2022-12-28 ENCOUNTER — Other Ambulatory Visit (HOSPITAL_BASED_OUTPATIENT_CLINIC_OR_DEPARTMENT_OTHER): Payer: Self-pay

## 2022-12-28 ENCOUNTER — Encounter: Payer: Self-pay | Admitting: Physician Assistant

## 2022-12-28 ENCOUNTER — Ambulatory Visit (INDEPENDENT_AMBULATORY_CARE_PROVIDER_SITE_OTHER): Payer: 59 | Admitting: Physician Assistant

## 2022-12-28 VITALS — BP 93/58 | HR 85 | Temp 98.0°F | Ht 60.0 in | Wt 179.0 lb

## 2022-12-28 DIAGNOSIS — R42 Dizziness and giddiness: Secondary | ICD-10-CM | POA: Diagnosis not present

## 2022-12-28 DIAGNOSIS — G4733 Obstructive sleep apnea (adult) (pediatric): Secondary | ICD-10-CM

## 2022-12-28 DIAGNOSIS — E1165 Type 2 diabetes mellitus with hyperglycemia: Secondary | ICD-10-CM

## 2022-12-28 DIAGNOSIS — E782 Mixed hyperlipidemia: Secondary | ICD-10-CM

## 2022-12-28 DIAGNOSIS — R040 Epistaxis: Secondary | ICD-10-CM

## 2022-12-28 DIAGNOSIS — R239 Unspecified skin changes: Secondary | ICD-10-CM | POA: Diagnosis not present

## 2022-12-28 DIAGNOSIS — Z8249 Family history of ischemic heart disease and other diseases of the circulatory system: Secondary | ICD-10-CM

## 2022-12-28 DIAGNOSIS — I10 Essential (primary) hypertension: Secondary | ICD-10-CM | POA: Diagnosis not present

## 2022-12-28 MED ORDER — ATORVASTATIN CALCIUM 40 MG PO TABS
40.0000 mg | ORAL_TABLET | Freq: Every day | ORAL | 2 refills | Status: DC
  Filled 2022-12-28 – 2023-02-09 (×3): qty 90, 90d supply, fill #0

## 2022-12-28 MED ORDER — OZEMPIC (1 MG/DOSE) 4 MG/3ML ~~LOC~~ SOPN
1.0000 mg | PEN_INJECTOR | SUBCUTANEOUS | 0 refills | Status: DC
  Filled 2022-12-28 – 2023-01-18 (×3): qty 9, 84d supply, fill #0

## 2022-12-28 NOTE — Patient Instructions (Addendum)
Nasal ayr gel  Cut back ASA 81mg  Make referral for ENT  Nosebleed, Adult A nosebleed is when blood comes out of the nose. Nosebleeds are common. Usually, they are not a sign of a serious condition. Nosebleeds can happen if a blood vessel in your nose starts to bleed or if the lining of your nose (mucous membrane) cracks. They are commonly caused by: Allergies. Colds. Picking your nose. Blowing your nose too hard. An injury from sticking an object into your nose or getting hit in the nose. Dry or cold air. Less common causes of nosebleeds include: Toxic fumes. Something abnormal in the nose or in the air-filled spaces in the bones of the face (sinuses). Growths in the nose, such as polyps. Blood thinners or conditions that cause blood to clot slowly. Certain illnesses or procedures that irritate or dry out the nasal passages. Follow these instructions at home: When you have a nosebleed:  Sit down and tilt your head slightly forward. Use a clean towel or tissue to pinch your nostrils under the bony part of your nose. After 5 minutes, let go of your nose and see if bleeding starts again. Do not release pressure before that time. If there is still bleeding, repeat the pinching and holding for 5 minutes or until the bleeding stops. Do not place tissues or gauze in the nose to stop the bleeding. Avoid lying down and avoid tilting your head backward. That may make blood collect in the throat and cause gagging or coughing. Use a nasal spray decongestant to help with a nosebleed as told by your health care provider. After a nosebleed: Avoid blowing your nose or sniffing for a number of hours. Avoid straining, lifting, or bending at the waist for several days. You may go back to other normal activities as you are able. If you are taking aspirin or blood thinners and you have nosebleeds, talk to your health care provider. These medicines make bleeding more likely. Ask your health care provider if  you should stop taking the medicines or if you should adjust the dose. Do not stop taking medicines that your health care provider has recommended unless he or she tells you to stop taking them. If your nosebleed was caused by dry mucous membranes, use over-the-counter saline nasal spray or gel and a humidifier as told by your health care provider. This will keep the mucous membranes moist and allow them to heal. If you need to use one of these products: Choose one that is water-soluble. Use only as much as you need and use it only as often as needed. Do not lie down right after you use it. If you get nosebleeds often, talk with your health care provider about medical treatments. Options may include: Nasal cautery. This treatment stops and prevents nosebleeds by using a chemical swab or electrical device to lightly burn tiny blood vessels inside the nose. Nasal packing. A gauze or other material is placed in the nose to keep constant pressure on the bleeding area. Contact a health care provider if you: Have a fever. Get nosebleeds often or more often than usual. Bruise very easily. Have a nosebleed from having something stuck in your nose. Have bleeding in your mouth. Vomit or cough up brown material. Have a nosebleed after you start a new medicine. Get help right away if: You have a nosebleed after a fall or a head injury. Your nosebleed does not go away after 20 minutes. You feel dizzy or weak. You have  unusual bleeding from other parts of your body. You have unusual bruising on other parts of your body. You become sweaty. You vomit blood. Summary A nosebleed is when blood comes out of the nose. Common causes include allergies, an injury to the nose, or cold or dry air. Initial treatment includes applying pressure for 5 minutes. Moisturizing the nose with saline nasal spray or gel after a nosebleed may help prevent future bleeding. Get help right away if your nosebleed does not go away  after 20 minutes. This information is not intended to replace advice given to you by your health care provider. Make sure you discuss any questions you have with your health care provider. Document Revised: 06/13/2019 Document Reviewed: 06/13/2019 Elsevier Patient Education  2022 [employer].

## 2022-12-28 NOTE — Progress Notes (Signed)
Acute Office Visit  Subjective:     Patient ID: Kristen Bright, female    DOB: [birth year], 57 y.o.   MRN: [id number]  Chief Complaint  Patient presents with   Epistaxis    HPI Patient is in today for nosebleeds and follow up on dizziness.   Pt has had nosebleeds over the past 3 months. She had 6-8 total but 2 in the last week. Last 2 were all while sitting and leaning over and just started. She is able to stop them quickly. Only left nare. Last about 10 minutes. She is on 2 81mg  ASA daily. She has used flonase recently but not using daily. No congestion or sinus pressure, or ear pain. Dizziness is much better but still occurs some when she turns her head to the right. No vision changes. She had some fluid in her ear but seemed to clear with flonase. She would like that looked at today.   Pt has some skin changes and would like to see dermatology.   Pt is just overall concerned with her CV risk due to her family history and chronic diseases. She checks BP during the day and under 130/80 and wants to keep it low. She wonders if there is anything else she can do.   Active Ambulatory Problems    Diagnosis Date Noted   ELEVATED BLOOD PRESSURE WITHOUT DIAGNOSIS OF HYPERTENSION 11/09/2010   OSA (obstructive sleep apnea) 09/30/2013   Heart murmur 06/24/2014   Fatty liver disease, nonalcoholic 09/10/2014   Class 2 severe obesity due to excess calories with serious comorbidity and body mass index (BMI) of 35.0 to 35.9 in adult (HCC) 11/04/2014   Weight gain 11/04/2014   Stress 09/14/2015   GAD (generalized anxiety disorder) 09/14/2015   Essential hypertension, benign 09/14/2015   DDD (degenerative disc disease), cervical 08/18/2016   Mixed hyperlipidemia 01/18/2017   Memory changes 11/19/2017   Type 2 diabetes mellitus with hyperglycemia, without long-term current use of insulin (HCC) 06/25/2019   Low serum potassium 06/26/2019   Gastroesophageal reflux disease 06/26/2019   History of  COVID-19 09/23/2019   Throat congestion 02/16/2021   Actinic keratosis 10/05/2021   Menopausal symptoms 12/14/2021   Abdominal bloating 12/14/2021   Non-restorative sleep 05/20/2022   Epistaxis 12/28/2022   Recent skin changes 12/28/2022   Resolved Ambulatory Problems    Diagnosis Date Noted   ANXIETY 02/28/2010   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     Objective:    BP (!) 93/58   Pulse 85   Temp 98 F (36.7 C) (Oral)   Ht 5' (1.524 m)   Wt 179 lb (81.2 kg)   SpO2 99%   BMI 34.96 kg/m  BP Readings from Last 3 Encounters:  12/28/22 (!) 93/58  10/19/22 122/65  05/20/22 (!) 107/59   Wt Readings from Last 3 Encounters:  12/28/22 179 lb (81.2 kg)  10/19/22 179 lb 1.3 oz (81.2 kg)  07/13/22 175 lb (79.4 kg)      Physical Exam Vitals reviewed.  Constitutional:      Appearance: Normal appearance. She is obese.  HENT:     Head: Normocephalic.     Right Ear: Tympanic membrane, ear canal and external ear normal. There is no impacted cerumen.     Left Ear: Tympanic membrane, ear canal and external ear normal. There is no impacted cerumen.     Nose: No congestion or rhinorrhea.     Comments: Left nare less patient with swollen turbinate but no active bleeding or findings of where bleeding was occurring.     Mouth/Throat:     Mouth: Mucous membranes are moist.     Pharynx: No posterior oropharyngeal erythema.  Eyes:     Conjunctiva/sclera: Conjunctivae normal.     Pupils: Pupils are equal, round, and reactive to light.  Cardiovascular:     Rate and Rhythm: Normal rate and regular rhythm.     Pulses: Normal  pulses.     Heart sounds: Normal heart sounds.  Pulmonary:     Effort: Pulmonary effort is normal.     Breath sounds: Normal breath sounds.  Musculoskeletal:     Cervical back: Normal range of motion and neck supple. No tenderness.     Right lower leg: No edema.     Left lower leg: No edema.     Comments: Negative Dix Hallpike  Lymphadenopathy:     Cervical: No cervical adenopathy.  Neurological:     General: No focal deficit present.     Mental Status: She is alert and oriented to person, place, and time.     Motor: No weakness.     Gait: Gait normal.  Psychiatric:        Mood and Affect: Mood normal.          Assessment & Plan:  Marland KitchenMarland KitchenAverly was seen today for epistaxis.  Diagnoses and all orders for this visit:  Epistaxis -     Ambulatory referral to ENT  Mixed hyperlipidemia -     atorvastatin (LIPITOR) 40 MG tablet; Take 1 tablet (40 mg total) by mouth daily. -     CT CARDIAC SCORING (SELF PAY ONLY); Future  Type 2 diabetes mellitus with hyperglycemia, without long-term current use of insulin (HCC) -     Semaglutide, 1 MG/DOSE, (OZEMPIC, 1 MG/DOSE,) 4 MG/3ML SOPN; Inject 1 mg into the skin once a week. -     CT CARDIAC SCORING (SELF PAY ONLY); Future  Recent skin changes -     Ambulatory referral to Dermatology  Family history of ischemic heart disease -     CT CARDIAC SCORING (SELF PAY ONLY); Future  OSA (obstructive sleep apnea) -     CT CARDIAC SCORING (SELF PAY ONLY); Future  Essential hypertension, benign -     CT CARDIAC SCORING (SELF PAY ONLY); Future  Dizziness   Discussed patient CV risk . 10 year is low but she is DM with HTN and has a strong family hx.  Calcium Score CT screening ordered.  LDL to goal BP low but to goal On statin A1C to goal  ..The 10-year ASCVD risk score (Arnett DK, et al., 2019) is: 2.2%   Values used to calculate the score:     Age: 13 years     Sex: Female     Is Non-Hispanic African American: No     Diabetic: Yes      Tobacco smoker: No     Systolic Blood Pressure: 93 mmHg     Is BP treated: Yes     HDL Cholesterol: 52 mg/dL     Total Cholesterol:  158 mg/dL  Z6X is controlled and BP controlled BP low today and discussed to watch for hypotension signs Continue on BP medications for now.   No active bleeding in left nare seen on exam Referral to ENT for cauterization if needed Cut back on ASA 81mg  a day Use afrin for rescue Consider nasal ayr gel for moisture Follow up as needed  Dizziness has improved Ears look great today Dixhallpike negative ? Low BP causing some symptoms Make sure staying hydrated Follow up as needed   Tandy Gaw, PA-C

## 2023-01-12 DIAGNOSIS — R040 Epistaxis: Secondary | ICD-10-CM | POA: Diagnosis not present

## 2023-01-13 ENCOUNTER — Ambulatory Visit: Payer: 59 | Admitting: Physician Assistant

## 2023-01-17 ENCOUNTER — Other Ambulatory Visit (HOSPITAL_BASED_OUTPATIENT_CLINIC_OR_DEPARTMENT_OTHER): Payer: Self-pay

## 2023-01-17 DIAGNOSIS — G4733 Obstructive sleep apnea (adult) (pediatric): Secondary | ICD-10-CM | POA: Diagnosis not present

## 2023-01-18 ENCOUNTER — Other Ambulatory Visit (HOSPITAL_BASED_OUTPATIENT_CLINIC_OR_DEPARTMENT_OTHER): Payer: Self-pay

## 2023-01-18 ENCOUNTER — Other Ambulatory Visit: Payer: Self-pay

## 2023-01-25 DIAGNOSIS — G4733 Obstructive sleep apnea (adult) (pediatric): Secondary | ICD-10-CM | POA: Diagnosis not present

## 2023-02-09 ENCOUNTER — Other Ambulatory Visit (HOSPITAL_BASED_OUTPATIENT_CLINIC_OR_DEPARTMENT_OTHER): Payer: Self-pay

## 2023-02-15 DIAGNOSIS — R040 Epistaxis: Secondary | ICD-10-CM | POA: Diagnosis not present

## 2023-02-17 DIAGNOSIS — G4733 Obstructive sleep apnea (adult) (pediatric): Secondary | ICD-10-CM | POA: Diagnosis not present

## 2023-02-21 ENCOUNTER — Encounter: Payer: Self-pay | Admitting: Dermatology

## 2023-02-21 ENCOUNTER — Ambulatory Visit (INDEPENDENT_AMBULATORY_CARE_PROVIDER_SITE_OTHER): Payer: 59 | Admitting: Dermatology

## 2023-02-21 DIAGNOSIS — W908XXA Exposure to other nonionizing radiation, initial encounter: Secondary | ICD-10-CM

## 2023-02-21 DIAGNOSIS — L821 Other seborrheic keratosis: Secondary | ICD-10-CM | POA: Diagnosis not present

## 2023-02-21 DIAGNOSIS — L570 Actinic keratosis: Secondary | ICD-10-CM

## 2023-02-21 DIAGNOSIS — L578 Other skin changes due to chronic exposure to nonionizing radiation: Secondary | ICD-10-CM

## 2023-02-21 NOTE — Progress Notes (Signed)
   New Patient Visit   Subjective  Kristen Bright is a 57 y.o. female who presents for the following: Skin Check on face  Patient states she has skin irritation located at the face that she would like to have examined. Patient reports the areas have been there for  several  year(s). She reports the areas are bothersome. She states around her eyebrows line is very tender, excessive redness and sore. She states that the areas have not spread. Patient reports she has previously been treated for these areas. She has had similar spots frozen off by Dr. Emily Filbert.She reports she has had excessive sun  Patient denies Hx of bx. Patient reports family history of skin cancer(s)(Dad).    The following portions of the chart were reviewed this encounter and updated as appropriate: medications, allergies, medical history  Review of Systems:  No other skin or systemic complaints except as noted in HPI or Assessment and Plan.  Objective  Well appearing patient in no apparent distress; mood and affect are within normal limits.  A focused examination was performed of the following areas: Face  Relevant exam findings are noted in the Assessment and Plan.  Left Eyebrow, Left Temple, Right Breast, Right Eyebrow (2) Severe, confluent actinic changes with pre-cancerous actinic keratoses that is secondary to cumulative UV radiation exposure over time         Assessment & Plan   ACTINIC DAMAGE WITH PRECANCEROUS ACTINIC KERATOSES Counseling for Topical Chemotherapy Management: Patient exhibits: - Severe, confluent actinic changes with pre-cancerous actinic keratoses that is secondary to cumulative UV radiation exposure over time - Condition that is severe; chronic, not at goal. - diffuse scaly erythematous macules and papules with underlying dyspigmentation - Discussed Prescription "Field Treatment" topical Chemotherapy for Severe, Chronic Confluent Actinic Changes with Pre-Cancerous Actinic Keratoses Field  treatment involves treatment of an entire area of skin that has confluent Actinic Changes (Sun/ Ultraviolet light damage) and PreCancerous Actinic Keratoses by method of PhotoDynamic Therapy (PDT) and/or prescription Topical Chemotherapy agents such as 5-fluorouracil, 5-fluorouracil/calcipotriene, and/or imiquimod.  The purpose is to decrease the number of clinically evident and subclinical PreCancerous lesions to prevent progression to development of skin cancer by chemically destroying early precancer changes that may or may not be visible.  It has been shown to reduce the risk of developing skin cancer in the treated area. As a result of treatment, redness, scaling, crusting, and open sores may occur during treatment course. One or more than one of these methods may be used and may have to be used several times to control, suppress and eliminate the PreCancerous changes. Discussed treatment course, expected reaction, and possible side effects. - Recommend daily broad spectrum sunscreen SPF 30+ to sun-exposed areas, reapply every 2 hours as needed.  - Staying in the shade or wearing long sleeves, sun glasses (UVA+UVB protection) and wide brim hats (4-inch brim around the entire circumference of the hat) are also recommended. - Call for new or changing lesions.  SEBORRHEIC KERATOSES- Benign normal skin lesions - Benign-appearing - Call for any changes   Return in about 4 months (around 06/23/2023) for TBSE F/U.  Documentation: I have reviewed the above documentation for accuracy and completeness, and I agree with the above.  Stasia Cavalier, am acting as scribe for Langston Reusing, DO.  Langston Reusing, DO

## 2023-02-21 NOTE — Patient Instructions (Addendum)
Here is a summary of the key instructions and recommendations from today's consultation:  - Treatment for Actinic Keratosis: We have treated the actinic keratosis spots on your face with liquid nitrogen today. Please apply Vaseline to the treated areas to aid in healing. - Skin Care Routine:   - Continue using your current daily skin moisturizer with sunscreen.   - Start using Neutrogena's 0.5% retinol serum three nights a week, and gradually increase to nightly use to improve skin texture. - Follow-Up Appointments:   - Please return in six months for a follow-up on the treated spots.   - At the same visit, we will conduct a full skin cancer screening, particularly focusing on your shoulders and back due to your fair skin and history of sun exposure.  Please remember to avoid using topical chemotherapy creams during the summer months to prevent adverse reactions from sun exposure.           Cryotherapy Aftercare  Wash gently with soap and water everyday.   Apply Vaseline and Band-Aid daily until healed.   Due to recent changes in healthcare laws, you may see results of your pathology and/or laboratory studies on MyChart before the doctors have had a chance to review them. We understand that in some cases there may be results that are confusing or concerning to you. Please understand that not all results are received at the same time and often the doctors may need to interpret multiple results in order to provide you with the best plan of care or course of treatment. Therefore, we ask that you please give [place] 2 business days to thoroughly review all your results before contacting the office for clarification. Should we see a critical lab result, you will be contacted sooner.   If You Need Anything After Your Visit  If you have any questions or concerns for your doctor, please call our main line at [phone] If no one answers, please leave a voicemail as directed and we will return  your call as soon as possible. Messages left after 4 pm will be answered the following business day.   You may also send [place] a message via MyChart. We typically respond to MyChart messages within 1-2 business days.  For prescription refills, please ask your pharmacy to contact our office. Our fax number is [phone].  If you have an urgent issue when the clinic is closed that cannot wait until the next business day, you can page your doctor at the number below.    Please note that while we do our best to be available for urgent issues outside of office hours, we are not available 24/7.   If you have an urgent issue and are unable to reach [place], you may choose to seek medical care at your doctor's office, retail clinic, urgent care center, or emergency room.  If you have a medical emergency, please immediately call 911 or go to the emergency department. In the event of inclement weather, please call our main line at [phone] for an update on the status of any delays or closures.  Dermatology Medication Tips: Please keep the boxes that topical medications come in in order to help keep track of the instructions about where and how to use these. Pharmacies typically print the medication instructions only on the boxes and not directly on the medication tubes.   If your medication is too expensive, please contact our office at [phone] or send [place] a message through MyChart.   We are  unable to tell what your co-pay for medications will be in advance as this is different depending on your insurance coverage. However, we may be able to find a substitute medication at lower cost or fill out paperwork to get insurance to cover a needed medication.   If a prior authorization is required to get your medication covered by your insurance company, please allow [place] 1-2 business days to complete this process.  Drug prices often vary depending on where the prescription is filled and some pharmacies may  offer cheaper prices.  The website www.goodrx.com contains coupons for medications through different pharmacies. The prices here do not account for what the cost may be with help from insurance (it may be cheaper with your insurance), but the website can give you the price if you did not use any insurance.  - You can print the associated coupon and take it with your prescription to the pharmacy.  - You may also stop by our office during regular business hours and pick up a GoodRx coupon card.  - If you need your prescription sent electronically to a different pharmacy, notify our office through [facility] or by phone at [phone]

## 2023-03-03 ENCOUNTER — Ambulatory Visit (INDEPENDENT_AMBULATORY_CARE_PROVIDER_SITE_OTHER): Payer: Self-pay

## 2023-03-03 ENCOUNTER — Ambulatory Visit (INDEPENDENT_AMBULATORY_CARE_PROVIDER_SITE_OTHER): Payer: 59

## 2023-03-03 ENCOUNTER — Ambulatory Visit (INDEPENDENT_AMBULATORY_CARE_PROVIDER_SITE_OTHER): Payer: 59 | Admitting: Sports Medicine

## 2023-03-03 ENCOUNTER — Other Ambulatory Visit (HOSPITAL_BASED_OUTPATIENT_CLINIC_OR_DEPARTMENT_OTHER): Payer: Self-pay

## 2023-03-03 DIAGNOSIS — E782 Mixed hyperlipidemia: Secondary | ICD-10-CM

## 2023-03-03 DIAGNOSIS — Z8249 Family history of ischemic heart disease and other diseases of the circulatory system: Secondary | ICD-10-CM

## 2023-03-03 DIAGNOSIS — G4733 Obstructive sleep apnea (adult) (pediatric): Secondary | ICD-10-CM

## 2023-03-03 DIAGNOSIS — M7712 Lateral epicondylitis, left elbow: Secondary | ICD-10-CM

## 2023-03-03 DIAGNOSIS — E1165 Type 2 diabetes mellitus with hyperglycemia: Secondary | ICD-10-CM

## 2023-03-03 DIAGNOSIS — I10 Essential (primary) hypertension: Secondary | ICD-10-CM

## 2023-03-03 DIAGNOSIS — M19022 Primary osteoarthritis, left elbow: Secondary | ICD-10-CM | POA: Diagnosis not present

## 2023-03-03 MED ORDER — PREDNISONE 50 MG PO TABS
50.0000 mg | ORAL_TABLET | Freq: Every day | ORAL | 0 refills | Status: DC
  Filled 2023-03-03: qty 5, 5d supply, fill #0

## 2023-03-03 MED ORDER — TRAMADOL HCL 50 MG PO TABS
50.0000 mg | ORAL_TABLET | Freq: Three times a day (TID) | ORAL | 0 refills | Status: DC | PRN
  Filled 2023-03-03: qty 21, 7d supply, fill #0

## 2023-03-03 NOTE — Assessment & Plan Note (Signed)
Pleasant 57 year old female, 2 months of pain over left lateral elbow worse with gripping motions, she did some dry needling and massage with PT but has not done any eccentric strengthening or conditioning. On exam today she does have tenderness at the common extensor tendon origin reproduced with gripping, she did try tennis elbow brace without improvement. She is doing Aleve without improvement. She hopes to do a good amount of work this weekend, I did advise her that due to the high demand on this elbow this weekend we will hold off on injection, we will do a 5-day course of steroids, tramadol for breakthrough pain, x-rays, formal PT but exclusively eccentric conditioning of the common extensor tendon. Return to see me about 4 weeks, will do injection if not better at that time.

## 2023-03-03 NOTE — Progress Notes (Signed)
    Procedures performed today:    None.  Independent interpretation of notes and tests performed by another provider:   None.  Brief History, Exam, Impression, and Recommendations:    Left tennis elbow Pleasant 57 year old female, 2 months of pain over left lateral elbow worse with gripping motions, she did some dry needling and massage with PT but has not done any eccentric strengthening or conditioning. On exam today she does have tenderness at the common extensor tendon origin reproduced with gripping, she did try tennis elbow brace without improvement. She is doing Aleve without improvement. She hopes to do a good amount of work this weekend, I did advise her that due to the high demand on this elbow this weekend we will hold off on injection, we will do a 5-day course of steroids, tramadol for breakthrough pain, x-rays, formal PT but exclusively eccentric conditioning of the common extensor tendon. Return to see me about 4 weeks, will do injection if not better at that time.  Chronic process with exacerbation and pharmacologic intervention  ____________________________________________ Ihor Austin. Benjamin Stain, M.D., ABFM., CAQSM., AME. Primary Care and Sports Medicine [facility]  Adjunct Professor of Family Medicine  [employer]  Restaurant manager, fast food

## 2023-03-07 NOTE — Progress Notes (Signed)
[name],   Your CT calcium score was elevated in the left anterior descending artery and left circumflex. I am making cardiology referral for consult. Certainly stay on lipitor and taking daily along with ASA 81mg  daily. Do you have any preference to cardiology want to stay at [facility] with Dr. Jens Som?

## 2023-03-09 ENCOUNTER — Other Ambulatory Visit (HOSPITAL_BASED_OUTPATIENT_CLINIC_OR_DEPARTMENT_OTHER): Payer: Self-pay

## 2023-03-09 ENCOUNTER — Encounter: Payer: Self-pay | Admitting: Physician Assistant

## 2023-03-09 ENCOUNTER — Ambulatory Visit (INDEPENDENT_AMBULATORY_CARE_PROVIDER_SITE_OTHER): Payer: 59 | Admitting: Physician Assistant

## 2023-03-09 VITALS — BP 114/63 | HR 78 | Ht 60.0 in | Wt 176.0 lb

## 2023-03-09 DIAGNOSIS — E6609 Other obesity due to excess calories: Secondary | ICD-10-CM

## 2023-03-09 DIAGNOSIS — E1169 Type 2 diabetes mellitus with other specified complication: Secondary | ICD-10-CM | POA: Diagnosis not present

## 2023-03-09 DIAGNOSIS — E782 Mixed hyperlipidemia: Secondary | ICD-10-CM | POA: Diagnosis not present

## 2023-03-09 DIAGNOSIS — R931 Abnormal findings on diagnostic imaging of heart and coronary circulation: Secondary | ICD-10-CM | POA: Diagnosis not present

## 2023-03-09 DIAGNOSIS — Z8249 Family history of ischemic heart disease and other diseases of the circulatory system: Secondary | ICD-10-CM

## 2023-03-09 DIAGNOSIS — Z7985 Long-term (current) use of injectable non-insulin antidiabetic drugs: Secondary | ICD-10-CM

## 2023-03-09 DIAGNOSIS — E66811 Obesity, class 1: Secondary | ICD-10-CM

## 2023-03-09 LAB — POCT GLYCOSYLATED HEMOGLOBIN (HGB A1C): Hemoglobin A1C: 6 % — AB (ref 4.0–5.6)

## 2023-03-09 MED ORDER — ATORVASTATIN CALCIUM 80 MG PO TABS
80.0000 mg | ORAL_TABLET | Freq: Every day | ORAL | 3 refills | Status: DC
Start: 2023-03-09 — End: 2024-03-06
  Filled 2023-03-09: qty 90, 90d supply, fill #0
  Filled 2023-06-15: qty 90, 90d supply, fill #1
  Filled 2023-09-05: qty 90, 90d supply, fill #2
  Filled 2023-12-10: qty 90, 90d supply, fill #3

## 2023-03-09 MED ORDER — DAPAGLIFLOZIN PROPANEDIOL 10 MG PO TABS
10.0000 mg | ORAL_TABLET | Freq: Every day | ORAL | 0 refills | Status: DC
Start: 2023-03-09 — End: 2023-07-17
  Filled 2023-03-09 – 2023-04-17 (×2): qty 90, 90d supply, fill #0

## 2023-03-09 MED ORDER — OZEMPIC (1 MG/DOSE) 4 MG/3ML ~~LOC~~ SOPN
1.0000 mg | PEN_INJECTOR | SUBCUTANEOUS | 0 refills | Status: DC
Start: 2023-03-09 — End: 2023-07-17
  Filled 2023-03-09 – 2023-04-17 (×2): qty 9, 84d supply, fill #0

## 2023-03-09 NOTE — Patient Instructions (Signed)
Doing great.

## 2023-03-09 NOTE — Progress Notes (Signed)
Established Patient Office Visit  Subjective   Patient ID: Kristen Bright, female    DOB: 1965/12/30  Age: 57 y.o. MRN: [id number]  Chief Complaint  Patient presents with   Follow-up    HPI Pt is a 57 yo obese female with T2DM, HTN, HLD who presents to the clinic for 3 month follow up.   She recently had Coronary Calcium Score CT and results were 212. She has appt with cardiology in September. She denies any CP, palpitations, headaches or vision changes. She is concerned due to her family hx with her father of heart disease. She has started exercising a day and eating vegan. She has lost 8lbs. She is taking ASA and lipitor daily.   She is not checking her sugars but compliant with medications.   Patient Active Problem List   Diagnosis Date Noted   Family history of ischemic heart disease 03/09/2023   Class 1 obesity due to excess calories with serious comorbidity and body mass index (BMI) of 34.0 to 34.9 in adult 03/09/2023   Elevated coronary artery calcium score 03/09/2023   Left tennis elbow 03/03/2023   Epistaxis 12/28/2022   Recent skin changes 12/28/2022   Non-restorative sleep 05/20/2022   Menopausal symptoms 12/14/2021   Abdominal bloating 12/14/2021   Actinic keratosis 10/05/2021   Throat congestion 02/16/2021   History of COVID-19 09/23/2019   Low serum potassium 06/26/2019   Gastroesophageal reflux disease 06/26/2019   Type 2 diabetes mellitus with hyperglycemia, without long-term current use of insulin (HCC) 06/25/2019   Memory changes 11/19/2017   Mixed hyperlipidemia 01/18/2017   DDD (degenerative disc disease), cervical 08/18/2016   Stress 09/14/2015   GAD (generalized anxiety disorder) 09/14/2015   Essential hypertension, benign 09/14/2015   Class 2 severe obesity due to excess calories with serious comorbidity and body mass index (BMI) of 35.0 to 35.9 in adult (HCC) 11/04/2014   Weight gain 11/04/2014   Fatty liver disease, nonalcoholic  09/10/2014   Heart murmur 06/24/2014   OSA (obstructive sleep apnea) 09/30/2013   ELEVATED BLOOD PRESSURE WITHOUT DIAGNOSIS OF HYPERTENSION 11/09/2010   Past Medical History:  Diagnosis Date   Anxiety    Diabetes mellitus without complication (HCC)    Hyperlipidemia    Hypertension    Past Surgical History:  Procedure Laterality Date   CESAREAN SECTION     CHOLECYSTECTOMY     Family History  Problem Relation Age of Onset   Diabetes Other        fam hx   Hyperlipidemia Other        fam hx   Hypertension Other        fam hx   Heart disease Other        fam hx   Heart attack Father    Hyperlipidemia Father    Hypertension Father    Allergies  Allergen Reactions   Lisinopril     Swelling  Per patient history       Review of Systems  All other systems reviewed and are negative.     Objective:     BP 114/63   Pulse 78   Ht 5' (1.524 m)   Wt 176 lb (79.8 kg)   SpO2 99%   BMI 34.37 kg/m  BP Readings from Last 3 Encounters:  03/09/23 114/63  12/28/22 (!) 93/58  10/19/22 122/65   Wt Readings from Last 3 Encounters:  03/09/23 176 lb (79.8 kg)  12/28/22 179 lb (81.2 kg)  10/19/22 179 lb  1.3 oz (81.2 kg)      .Marland Kitchen Results for orders placed or performed in visit on 03/09/23  POCT HgB A1C  Result Value Ref Range   Hemoglobin A1C 6.0 (A) 4.0 - 5.6 %   HbA1c POC (<> result, manual entry)     HbA1c, POC (prediabetic range)     HbA1c, POC (controlled diabetic range)      Physical Exam Constitutional:      Appearance: Normal appearance. She is obese.  HENT:     Head: Normocephalic.  Neck:     Vascular: No carotid bruit.  Cardiovascular:     Rate and Rhythm: Normal rate and regular rhythm.     Pulses: Normal pulses.  Pulmonary:     Effort: Pulmonary effort is normal.     Breath sounds: Normal breath sounds.  Musculoskeletal:     Cervical back: Normal range of motion and neck supple. No rigidity or tenderness.     Right lower leg: No edema.     Left  lower leg: No edema.  Lymphadenopathy:     Cervical: No cervical adenopathy.  Neurological:     General: No focal deficit present.     Mental Status: She is alert and oriented to person, place, and time.  Psychiatric:        Mood and Affect: Mood normal.         Assessment & Plan:  Marland KitchenMarland KitchenDaila was seen today for follow-up.  Diagnoses and all orders for this visit:  Type 2 diabetes mellitus with other specified complication, without long-term current use of insulin (HCC) -     POCT HgB A1C -     dapagliflozin propanediol (FARXIGA) 10 MG TABS tablet; Take 1 tablet (10 mg total) by mouth daily. -     Semaglutide, 1 MG/DOSE, (OZEMPIC, 1 MG/DOSE,) 4 MG/3ML SOPN; Inject 1 mg into the skin once a week.  Family history of ischemic heart disease -     atorvastatin (LIPITOR) 80 MG tablet; Take 1 tablet (80 mg total) by mouth daily.  Class 1 obesity due to excess calories with serious comorbidity and body mass index (BMI) of 34.0 to 34.9 in adult  Mixed hyperlipidemia -     atorvastatin (LIPITOR) 80 MG tablet; Take 1 tablet (80 mg total) by mouth daily.  Elevated coronary artery calcium score -     atorvastatin (LIPITOR) 80 MG tablet; Take 1 tablet (80 mg total) by mouth daily.   A1C has improved to 6.0 Continue same medications BP to goal and on ARB On statin, LDL 82, increased to 80mg  daily Discussed elevated calcium score and importance of LDL/DM/HTN control Stay on ASA daily Follow up with cardiology for consult Eye and foot exam UTD. Vaccines UTD.   Follow up in 3 months.    Return in about 3 months (around 06/09/2023).    Tandy Gaw, PA-C

## 2023-03-19 DIAGNOSIS — G4733 Obstructive sleep apnea (adult) (pediatric): Secondary | ICD-10-CM | POA: Diagnosis not present

## 2023-03-31 ENCOUNTER — Ambulatory Visit (INDEPENDENT_AMBULATORY_CARE_PROVIDER_SITE_OTHER): Payer: 59 | Admitting: Sports Medicine

## 2023-03-31 DIAGNOSIS — M7712 Lateral epicondylitis, left elbow: Secondary | ICD-10-CM | POA: Diagnosis not present

## 2023-03-31 NOTE — Assessment & Plan Note (Signed)
75% better with conservative treatment including home/formal PT. She is continuing to improve, we can watch this for now, I have suggested more of a Mediterranean type diet.  She is already switched her diet around due to her coronary calcium score.

## 2023-03-31 NOTE — Progress Notes (Signed)
    Procedures performed today:    None.  Independent interpretation of notes and tests performed by another provider:   None.  Brief History, Exam, Impression, and Recommendations:    Left tennis elbow 75% better with conservative treatment including home/formal PT. She is continuing to improve, we can watch this for now, I have suggested more of a Mediterranean type diet.  She is already switched her diet around due to her coronary calcium score.    ____________________________________________ Ihor Austin. Benjamin Stain, M.D., ABFM., CAQSM., AME. Primary Care and Sports Medicine [facility]  Adjunct Professor of Family Medicine  [employer] [facility] [employer]  Restaurant manager, fast food

## 2023-03-31 NOTE — Patient Instructions (Signed)

## 2023-04-17 ENCOUNTER — Other Ambulatory Visit: Payer: Self-pay

## 2023-04-17 ENCOUNTER — Encounter: Payer: Self-pay | Admitting: Physician Assistant

## 2023-04-17 ENCOUNTER — Other Ambulatory Visit (HOSPITAL_BASED_OUTPATIENT_CLINIC_OR_DEPARTMENT_OTHER): Payer: Self-pay

## 2023-04-17 ENCOUNTER — Ambulatory Visit (INDEPENDENT_AMBULATORY_CARE_PROVIDER_SITE_OTHER): Payer: 59

## 2023-04-17 ENCOUNTER — Ambulatory Visit: Payer: 59 | Admitting: Physician Assistant

## 2023-04-17 VITALS — BP 120/80 | HR 77 | Ht 60.0 in | Wt 183.0 lb

## 2023-04-17 DIAGNOSIS — I10 Essential (primary) hypertension: Secondary | ICD-10-CM | POA: Insufficient documentation

## 2023-04-17 DIAGNOSIS — J90 Pleural effusion, not elsewhere classified: Secondary | ICD-10-CM | POA: Diagnosis not present

## 2023-04-17 DIAGNOSIS — G4733 Obstructive sleep apnea (adult) (pediatric): Secondary | ICD-10-CM | POA: Diagnosis not present

## 2023-04-17 DIAGNOSIS — R5383 Other fatigue: Secondary | ICD-10-CM

## 2023-04-17 DIAGNOSIS — R5381 Other malaise: Secondary | ICD-10-CM

## 2023-04-17 DIAGNOSIS — R635 Abnormal weight gain: Secondary | ICD-10-CM | POA: Diagnosis not present

## 2023-04-17 DIAGNOSIS — R0602 Shortness of breath: Secondary | ICD-10-CM | POA: Insufficient documentation

## 2023-04-17 DIAGNOSIS — R0601 Orthopnea: Secondary | ICD-10-CM | POA: Diagnosis not present

## 2023-04-17 DIAGNOSIS — E1169 Type 2 diabetes mellitus with other specified complication: Secondary | ICD-10-CM

## 2023-04-17 DIAGNOSIS — Z7985 Long-term (current) use of injectable non-insulin antidiabetic drugs: Secondary | ICD-10-CM

## 2023-04-17 DIAGNOSIS — M7989 Other specified soft tissue disorders: Secondary | ICD-10-CM | POA: Diagnosis not present

## 2023-04-17 LAB — POCT UA - MICROALBUMIN
Creatinine, POC: 50 mg/dL
Microalbumin Ur, POC: 10 mg/L

## 2023-04-17 MED ORDER — FUROSEMIDE 20 MG PO TABS
20.0000 mg | ORAL_TABLET | Freq: Every day | ORAL | 0 refills | Status: DC
Start: 2023-04-17 — End: 2023-07-17
  Filled 2023-04-17: qty 30, 30d supply, fill #0

## 2023-04-17 NOTE — Progress Notes (Unsigned)
   Acute Office Visit  Subjective:     Patient ID: Kristen Bright, female    DOB: 03-Aug-1966, 57 y.o.   MRN: [id number]  No chief complaint on file.   HPI Patient is in today for ***  Saturday No fever   Faigue  Swelling  Thurday wth  No abdominal pain Constipated  Ducloax and bowel movements better  No sick contacts 3 negative covid     ROS      Objective:    There were no vitals taken for this visit. {Vitals History (Optional):23777}  Physical Exam  No results found for any visits on 04/17/23.      Assessment & Plan:   Problem List Items Addressed This Visit   None   No orders of the defined types were placed in this encounter.   No follow-ups on file.  Tandy Gaw, PA-C

## 2023-04-17 NOTE — Progress Notes (Signed)
You do have signs of fluid in lungs which is consistent with heart failure. Start lasix daily. Will get echo as soon as possible.

## 2023-04-17 NOTE — Progress Notes (Signed)
Trace protein in urine

## 2023-04-17 NOTE — Patient Instructions (Addendum)
Get labs Get CXR  Will order echo Take lasix daily for 5 days and then as needed for swelling

## 2023-04-18 ENCOUNTER — Encounter: Payer: Self-pay | Admitting: Physician Assistant

## 2023-04-18 DIAGNOSIS — R0601 Orthopnea: Secondary | ICD-10-CM | POA: Insufficient documentation

## 2023-04-18 DIAGNOSIS — R5381 Other malaise: Secondary | ICD-10-CM | POA: Insufficient documentation

## 2023-04-18 NOTE — Progress Notes (Signed)
Kidney and liver look good.  Some labs still pending.  Inflammatory markers normal.  Hemoglobin is low. Are you taking any iron supplements?   No urgent concerns.

## 2023-04-19 ENCOUNTER — Encounter: Payer: Self-pay | Admitting: Physician Assistant

## 2023-04-19 DIAGNOSIS — G4733 Obstructive sleep apnea (adult) (pediatric): Secondary | ICD-10-CM | POA: Diagnosis not present

## 2023-04-19 LAB — CBC WITH DIFFERENTIAL/PLATELET
Basophils Absolute: 0 10*3/uL (ref 0.0–0.2)
Basos: 0 %
EOS (ABSOLUTE): 0.1 10*3/uL (ref 0.0–0.4)
Eos: 1 %
Hematocrit: 33.8 % — ABNORMAL LOW (ref 34.0–46.6)
Hemoglobin: 11 g/dL — ABNORMAL LOW (ref 11.1–15.9)
Immature Grans (Abs): 0 10*3/uL (ref 0.0–0.1)
Immature Granulocytes: 1 %
Lymphocytes Absolute: 2 10*3/uL (ref 0.7–3.1)
Lymphs: 35 %
MCH: 29.3 pg (ref 26.6–33.0)
MCHC: 32.5 g/dL (ref 31.5–35.7)
MCV: 90 fL (ref 79–97)
Monocytes Absolute: 0.5 10*3/uL (ref 0.1–0.9)
Monocytes: 9 %
Neutrophils Absolute: 3 10*3/uL (ref 1.4–7.0)
Neutrophils: 54 %
Platelets: 251 10*3/uL (ref 150–450)
RBC: 3.75 x10E6/uL — ABNORMAL LOW (ref 3.77–5.28)
RDW: 13.6 % (ref 11.7–15.4)
WBC: 5.6 10*3/uL (ref 3.4–10.8)

## 2023-04-19 LAB — CMP14+EGFR
ALT: 16 IU/L (ref 0–32)
AST: 13 IU/L (ref 0–40)
Albumin: 4.1 g/dL (ref 3.8–4.9)
Alkaline Phosphatase: 65 IU/L (ref 44–121)
BUN/Creatinine Ratio: 15 (ref 9–23)
BUN: 11 mg/dL (ref 6–24)
Bilirubin Total: 0.5 mg/dL (ref 0.0–1.2)
CO2: 24 mmol/L (ref 20–29)
Calcium: 9.2 mg/dL (ref 8.7–10.2)
Chloride: 104 mmol/L (ref 96–106)
Creatinine, Ser: 0.73 mg/dL (ref 0.57–1.00)
Globulin, Total: 2.1 g/dL (ref 1.5–4.5)
Glucose: 130 mg/dL — ABNORMAL HIGH (ref 70–99)
Potassium: 3.7 mmol/L (ref 3.5–5.2)
Sodium: 141 mmol/L (ref 134–144)
Total Protein: 6.2 g/dL (ref 6.0–8.5)
eGFR: 96 mL/min/{1.73_m2} (ref >59)

## 2023-04-19 LAB — TSH: TSH: 3.36 u[IU]/mL (ref 0.450–4.500)

## 2023-04-19 LAB — MONO QUAL W/RFLX QN: Mono Qual W/Rflx Qn: NEGATIVE

## 2023-04-19 LAB — BRAIN NATRIURETIC PEPTIDE: BNP: 59.2 pg/mL (ref 0.0–100.0)

## 2023-04-19 LAB — C-REACTIVE PROTEIN: CRP: 1 mg/L (ref 0–10)

## 2023-04-19 LAB — SEDIMENTATION RATE: Sed Rate: 2 mm/h (ref 0–40)

## 2023-04-19 NOTE — Progress Notes (Signed)
Kristen Bright, are you feeling better with lasix? Your BNP(fluid around heart) is still in normal range but elevated some. Is echo scheduled?

## 2023-04-27 ENCOUNTER — Other Ambulatory Visit: Payer: Self-pay | Admitting: Physician Assistant

## 2023-04-27 DIAGNOSIS — R635 Abnormal weight gain: Secondary | ICD-10-CM

## 2023-04-27 DIAGNOSIS — R0602 Shortness of breath: Secondary | ICD-10-CM

## 2023-04-27 DIAGNOSIS — I10 Essential (primary) hypertension: Secondary | ICD-10-CM

## 2023-04-27 DIAGNOSIS — M7989 Other specified soft tissue disorders: Secondary | ICD-10-CM

## 2023-04-27 DIAGNOSIS — E1169 Type 2 diabetes mellitus with other specified complication: Secondary | ICD-10-CM

## 2023-04-27 DIAGNOSIS — R0601 Orthopnea: Secondary | ICD-10-CM

## 2023-04-27 DIAGNOSIS — R5381 Other malaise: Secondary | ICD-10-CM

## 2023-04-27 DIAGNOSIS — G4733 Obstructive sleep apnea (adult) (pediatric): Secondary | ICD-10-CM

## 2023-05-04 ENCOUNTER — Ambulatory Visit (HOSPITAL_BASED_OUTPATIENT_CLINIC_OR_DEPARTMENT_OTHER)
Admission: RE | Admit: 2023-05-04 | Discharge: 2023-05-04 | Disposition: A | Discharge status: Home / Self Care | Payer: 59 | Source: Ambulatory Visit | Attending: Physician Assistant | Admitting: Physician Assistant

## 2023-05-04 DIAGNOSIS — R0602 Shortness of breath: Secondary | ICD-10-CM | POA: Insufficient documentation

## 2023-05-04 DIAGNOSIS — R5381 Other malaise: Secondary | ICD-10-CM | POA: Insufficient documentation

## 2023-05-04 DIAGNOSIS — M7989 Other specified soft tissue disorders: Secondary | ICD-10-CM | POA: Insufficient documentation

## 2023-05-04 DIAGNOSIS — R5383 Other fatigue: Secondary | ICD-10-CM | POA: Insufficient documentation

## 2023-05-04 LAB — ECHOCARDIOGRAM COMPLETE
AR max vel: 1.64 cm2
AV Area VTI: 1.68 cm2
AV Area mean vel: 1.72 cm2
AV Mean grad: 5 mmHg
AV Peak grad: 9.7 mmHg
AV Vena cont: 0.3 cm
Ao pk vel: 1.56 m/s
Area-P 1/2: 3.16 cm2
Calc EF: 65.2 %
P 1/2 time: 578 ms
S' Lateral: 2.4 cm
Single Plane A2C EF: 64.3 %
Single Plane A4C EF: 65.2 %

## 2023-05-05 ENCOUNTER — Encounter: Payer: Self-pay | Admitting: Physician Assistant

## 2023-05-05 DIAGNOSIS — I340 Nonrheumatic mitral (valve) insufficiency: Secondary | ICD-10-CM | POA: Insufficient documentation

## 2023-05-05 DIAGNOSIS — I351 Nonrheumatic aortic (valve) insufficiency: Secondary | ICD-10-CM | POA: Insufficient documentation

## 2023-05-05 NOTE — Progress Notes (Signed)
GREAT news echo of heart still has a great ejection fraction. You have some trivial mitral regurgitation. Mild aortic valve regurgitation. Will continue to keep an eye on but overall reassuring. When is your cardiology appt?

## 2023-05-09 NOTE — Progress Notes (Signed)
Referring-Jade Breeback PA-C Reason for referral-coronary calcification  HPI: 57 year old female for evaluation of coronary calcification at request of Tandy Gaw PA-C.  Calcium score March 03, 2023 212 which was 97th percentile.  Chest x-ray August 2024 showed increased markings at the base suggestive of possible edema and small bilateral effusions.  BNP August 2024 59.2.  Echocardiogram September 2024 showed normal LV function, mild aortic insufficiency.  Patient recently seen with increased dyspnea, weight gain and edema with results of testing as above.  She was given Lasix 20 mg daily.  Cardiology now asked to evaluate.  Patient states she does have some dyspnea with more vigorous activities.  She also feels an occasional chest fullness at that time.  These do not occur at rest.  No orthopnea, PND and pedal edema has resolved.  No syncope.  Current Outpatient Medications  Medication Sig Dispense Refill   aspirin 81 MG chewable tablet Chew 81 mg by mouth daily.     atorvastatin (LIPITOR) 80 MG tablet Take 1 tablet (80 mg total) by mouth daily. 90 tablet 3   Blood Glucose Monitoring Suppl (FREESTYLE LITE) w/Device KIT Check fasting blood sugar every morning and 2 hours after largest meal of the day. 1 kit PRN   dapagliflozin propanediol (FARXIGA) 10 MG TABS tablet Take 1 tablet (10 mg total) by mouth daily. 90 tablet 0   fluticasone (FLONASE) 50 MCG/ACT nasal spray SHAKE LIQUID AND USE 2 SPRAYS IN EACH NOSTRIL DAILY 16 g 0   furosemide (LASIX) 20 MG tablet Take 1 tablet (20 mg total) by mouth daily. 30 tablet 0   glucose blood (FREESTYLE LITE) test strip Check fasting blood sugar every morning and 2 hours after largest meal of the day. 100 each 12   Lancets (FREESTYLE) lancets Use to stick skin for testing. Check fasting blood sugar every morning and 2 hours after largest meal of the day. 100 each 12   Multiple Vitamin (MULTIVITAMIN) capsule Take 1 capsule by mouth daily.      olmesartan-hydrochlorothiazide (BENICAR HCT) 40-25 MG tablet TAKE 1 TABLET BY MOUTH ONCE DAILY 90 tablet 3   pantoprazole (PROTONIX) 40 MG tablet Take 1 tablet (40 mg total) by mouth daily. 90 tablet 3   Semaglutide, 1 MG/DOSE, (OZEMPIC, 1 MG/DOSE,) 4 MG/3ML SOPN Inject 1 mg into the skin once a week. 9 mL 0   traMADol (ULTRAM) 50 MG tablet Take 1 tablet (50 mg total) by mouth every 8 (eight) hours as needed for moderate pain. 21 tablet 0   venlafaxine XR (EFFEXOR XR) 75 MG 24 hr capsule Take 1 capsule (75 mg total) by mouth daily with breakfast. 90 capsule 3   No current facility-administered medications for this visit.    Allergies  Allergen Reactions   Lisinopril     Swelling  Per patient history      Past Medical History:  Diagnosis Date   Anxiety    Diabetes mellitus without complication (HCC)    Hyperlipidemia    Hypertension    OSA (obstructive sleep apnea)     Past Surgical History:  Procedure Laterality Date   CESAREAN SECTION     CHOLECYSTECTOMY      Social History   Socioeconomic History   Marital status: Married    Spouse name: Kathlene November   Number of children: 2   Years of education: Not on file   Highest education level: 12th grade  Occupational History    Employer: [email or web address]    Comment:  [place]  Tobacco Use   Smoking status: Former    Types: Cigarettes   Smokeless tobacco: Never   Tobacco comments:    Says smoked socially for about 3 years when younger  Substance and Sexual Activity   Alcohol use: Yes    Comment: rarely   Drug use: Yes   Sexual activity: Yes    Partners: Male  Other Topics Concern   Not on file  Social History Narrative   Some exercise.     Social Determinants of Health   Financial Resource Strain: Low Risk  (12/27/2022)   Overall Financial Resource Strain (CARDIA)    Difficulty of Paying Living Expenses: Not hard at all  Food Insecurity: No Food Insecurity (12/27/2022)   Hunger Vital Sign    Worried About  Running Out of Food in the Last Year: Never true    Ran Out of Food in the Last Year: Never true  Transportation Needs: No Transportation Needs (12/27/2022)   PRAPARE - Administrator, Civil Service (Medical): No    Lack of Transportation (Non-Medical): No  Physical Activity: Insufficiently Active (12/27/2022)   Exercise Vital Sign    Days of Exercise per Week: 2 days    Minutes of Exercise per Session: 30 min  Stress: No Stress Concern Present (12/27/2022)   [employer] of Occupational Health - Occupational Stress Questionnaire    Feeling of Stress : Not at all  Social Connections: Unknown (12/27/2022)   Social Connection and Isolation Panel [NHANES]    Frequency of Communication with Friends and Family: More than three times a week    Frequency of Social Gatherings with Friends and Family: Twice a week    Attends Religious Services: Patient declined    Database administrator or Organizations: Yes    Attends Engineer, structural: More than 4 times per year    Marital Status: Married  Catering manager Violence: Not on file    Family History  Problem Relation Age of Onset   Heart attack Father    Hyperlipidemia Father    Hypertension Father    Diabetes Other        fam hx   Hyperlipidemia Other        fam hx   Hypertension Other        fam hx   Heart disease Other        fam hx    ROS: no fevers or chills, productive cough, hemoptysis, dysphasia, odynophagia, melena, hematochezia, dysuria, hematuria, rash, seizure activity, orthopnea, PND, pedal edema, claudication. Remaining systems are negative.  Physical Exam:   Blood pressure 138/82, pulse 73, height 5' (1.524 m), weight 172 lb (78 kg), SpO2 98%.  General:  Well developed/well nourished in NAD Skin warm/dry Patient not depressed No peripheral clubbing Back-normal HEENT-normal/normal eyelids Neck supple/normal carotid upstroke bilaterally; no bruits; no JVD; no thyromegaly chest - CTA/  normal expansion CV - RRR/normal S1 and S2; no murmurs, rubs or gallops;  PMI nondisplaced Abdomen -NT/ND, no HSM, no mass, + bowel sounds, no bruit 2+ femoral pulses, no bruits Ext-no edema, chords, 2+ DP Neuro-grossly nonfocal  EKG Interpretation Date/Time:  Wednesday May 17 2023 09:14:29 EDT Ventricular Rate:  73 PR Interval:  148 QRS Duration:  76 QT Interval:  400 QTC Calculation: 440 R Axis:   71  Text Interpretation: Normal sinus rhythm Normal ECG No previous ECGs available Confirmed by Olga Millers ([id number]) on 05/17/2023 9:22:58 AM    A/P  1 coronary  calcification-patient has noted to have coronary calcification.  Treat with aspirin 81 mg daily and statin.  She is describing some dyspnea on exertion as well as chest fullness occasionally.  She also has diabetes and a strong family history of coronary disease.  I will arrange a coronary CTA to rule out obstructive coronary disease.  2 dyspnea-plan as outlined under #1.  3 hypertension-blood pressure is typically controlled at home.  We will follow and adjust regimen as needed.  Goal systolic blood pressure 130 and diastolic 85.  4 hyperlipidemia-Lipitor recently initiated.  In 2 weeks we will check lipids, liver and LP(a).  We will adjust regimen as needed.  5 obstructive sleep apnea-continue CPAP.  Olga Millers, MD

## 2023-05-17 ENCOUNTER — Ambulatory Visit: Payer: 59 | Attending: Cardiology | Admitting: Cardiology

## 2023-05-17 ENCOUNTER — Other Ambulatory Visit (HOSPITAL_BASED_OUTPATIENT_CLINIC_OR_DEPARTMENT_OTHER): Payer: Self-pay

## 2023-05-17 ENCOUNTER — Encounter: Payer: Self-pay | Admitting: Cardiology

## 2023-05-17 VITALS — BP 138/82 | HR 73 | Ht 60.0 in | Wt 172.0 lb

## 2023-05-17 DIAGNOSIS — I2510 Atherosclerotic heart disease of native coronary artery without angina pectoris: Secondary | ICD-10-CM | POA: Diagnosis not present

## 2023-05-17 DIAGNOSIS — G4733 Obstructive sleep apnea (adult) (pediatric): Secondary | ICD-10-CM | POA: Diagnosis not present

## 2023-05-17 DIAGNOSIS — E7800 Pure hypercholesterolemia, unspecified: Secondary | ICD-10-CM | POA: Diagnosis not present

## 2023-05-17 DIAGNOSIS — R072 Precordial pain: Secondary | ICD-10-CM

## 2023-05-17 DIAGNOSIS — R9431 Abnormal electrocardiogram [ECG] [EKG]: Secondary | ICD-10-CM | POA: Diagnosis not present

## 2023-05-17 MED ORDER — METOPROLOL TARTRATE 100 MG PO TABS
ORAL_TABLET | ORAL | 0 refills | Status: DC
  Filled 2023-05-17: qty 1, 1d supply, fill #0

## 2023-05-17 NOTE — Patient Instructions (Signed)
Lab Work:  Your physician recommends that you return for lab work in: 2-3 [facility]  If you have labs (blood work) drawn today and your tests are completely normal, you will receive your results only by: [employer] (if you have MyChart) OR A paper copy in the mail If you have any lab test that is abnormal or we need to change your treatment, we will call you to review the results.   Testing/Procedures:   Your cardiac CT will be scheduled at  [facility] [address] [place], [place] [address] [phone]   If scheduled at [facility], please arrive at the [facility] and Children's Entrance (Entrance C2) of [facility] 30 minutes prior to test start time. You can use the FREE valet parking offered at entrance C (encouraged to control the heart rate for the test)  Proceed to the [facility] Radiology Department (first floor) to check-in and test prep.  All radiology patients and guests should use entrance C2 at [facility], accessed from [facility], even though the hospital's physical address listed is [address].     Please follow these instructions carefully (unless otherwise directed):  An IV will be required for this test and Nitroglycerin will be given.    On the Night Before the Test: Be sure to Drink plenty of water. Do not consume any caffeinated/decaffeinated beverages or chocolate 12 hours prior to your test. Do not take any antihistamines 12 hours prior to your test.  On the Day of the Test: Drink plenty of water until 1 hour prior to the test. Do not eat any food 1 hour prior to test. You may take your regular medications prior to the test.  Take metoprolol (Lopressor) 100 MG two hours prior to test. DO NOT TAKE FUROSEMIDE THE MORNING OF THE CT SCAN FEMALES- please wear underwire-free bra if available, avoid dresses & tight clothing  After the Test: Drink plenty of water. After  receiving IV contrast, you may experience a mild flushed feeling. This is normal. On occasion, you may experience a mild rash up to 24 hours after the test. This is not dangerous. If this occurs, you can take Benadryl 25 mg and increase your fluid intake. If you experience trouble breathing, this can be serious. If it is severe call 911 IMMEDIATELY. If it is mild, please call our office.   We will call to schedule your test 2-4 weeks out understanding that some insurance companies will need an authorization prior to the service being performed.   For more information and frequently asked questions, please visit our website : [email or web address]  For non-scheduling related questions, please contact the cardiac imaging nurse navigator should you have any questions/concerns: Cardiac Imaging Nurse Navigators Direct Office Dial: [phone]   For scheduling needs, including cancellations and rescheduling, please call [place], [phone].    Follow-Up: At [facility], you and your health needs are our priority.  As part of our continuing mission to provide you with exceptional heart care, we have created designated Provider Care Teams.  These Care Teams include your primary Cardiologist (physician) and Advanced Practice Providers (APPs -  Physician Assistants and Nurse Practitioners) who all work together to provide you with the care you need, when you need it.  We recommend signing up for the patient portal called "MyChart".  Sign up information is provided on this After Visit Summary.  MyChart is used to connect with patients for Virtual  Visits (Telemedicine).  Patients are able to view lab/test results, encounter notes, upcoming appointments, etc.  Non-urgent messages can be sent to your provider as well.   To learn more about what you can do with MyChart, go to [email or web address].    Your next appointment:   6 month(s)  Provider:   Olga Millers, MD

## 2023-05-20 DIAGNOSIS — G4733 Obstructive sleep apnea (adult) (pediatric): Secondary | ICD-10-CM | POA: Diagnosis not present

## 2023-05-23 ENCOUNTER — Encounter (HOSPITAL_COMMUNITY): Payer: Self-pay

## 2023-05-25 ENCOUNTER — Ambulatory Visit: Payer: 59 | Admitting: Internal Medicine

## 2023-05-26 ENCOUNTER — Ambulatory Visit (HOSPITAL_BASED_OUTPATIENT_CLINIC_OR_DEPARTMENT_OTHER)
Admission: RE | Admit: 2023-05-26 | Discharge: 2023-05-26 | Disposition: A | Discharge status: Home / Self Care | Payer: 59 | Source: Ambulatory Visit | Attending: Cardiology | Admitting: Cardiology

## 2023-05-26 ENCOUNTER — Other Ambulatory Visit: Payer: Self-pay | Admitting: Cardiology

## 2023-05-26 ENCOUNTER — Ambulatory Visit (HOSPITAL_COMMUNITY)
Admission: RE | Admit: 2023-05-26 | Discharge: 2023-05-26 | Disposition: A | Discharge status: Home / Self Care | Payer: 59 | Source: Ambulatory Visit | Attending: Cardiology | Admitting: Cardiology

## 2023-05-26 DIAGNOSIS — R9431 Abnormal electrocardiogram [ECG] [EKG]: Secondary | ICD-10-CM | POA: Diagnosis present

## 2023-05-26 DIAGNOSIS — I2510 Atherosclerotic heart disease of native coronary artery without angina pectoris: Secondary | ICD-10-CM | POA: Diagnosis not present

## 2023-05-26 DIAGNOSIS — R931 Abnormal findings on diagnostic imaging of heart and coronary circulation: Secondary | ICD-10-CM

## 2023-05-26 DIAGNOSIS — R072 Precordial pain: Secondary | ICD-10-CM | POA: Insufficient documentation

## 2023-05-26 MED ORDER — NITROGLYCERIN 0.4 MG SL SUBL
SUBLINGUAL_TABLET | SUBLINGUAL | Status: AC
  Filled 2023-05-26: qty 2

## 2023-05-26 MED ORDER — NITROGLYCERIN 0.4 MG SL SUBL
0.8000 mg | SUBLINGUAL_TABLET | Freq: Once | SUBLINGUAL | Status: AC
  Administered 2023-05-26: 0.8 mg via SUBLINGUAL

## 2023-05-26 MED ORDER — IOHEXOL 350 MG/ML SOLN
100.0000 mL | Freq: Once | INTRAVENOUS | Status: AC | PRN
  Administered 2023-05-26: 100 mL via INTRAVENOUS

## 2023-05-29 ENCOUNTER — Encounter: Payer: Self-pay | Admitting: Cardiology

## 2023-06-13 NOTE — Progress Notes (Signed)
HPI: FU coronary artery disease. Echocardiogram September 2024 showed normal LV function, mild aortic insufficiency.  Coronary CTA September 2024 showed Ca score 325 (98 percentile); moderate stenosis mid LAD (50-69%) and otherwise mild disease.  FFR 0.85 in the mid LAD and 0.78 in the distal LAD.  Patient recently seen with increased dyspnea, weight gain and edema with results of testing as above.  Since last seen she has dyspnea with more vigorous activities but not routine activities.  No orthopnea, PND, pedal edema, chest pain or syncope.  Current Outpatient Medications  Medication Sig Dispense Refill   aspirin 81 MG chewable tablet Chew 81 mg by mouth daily.     atorvastatin (LIPITOR) 80 MG tablet Take 1 tablet (80 mg total) by mouth daily. 90 tablet 3   Blood Glucose Monitoring Suppl (FREESTYLE LITE) w/Device KIT Check fasting blood sugar every morning and 2 hours after largest meal of the day. 1 kit PRN   dapagliflozin propanediol (FARXIGA) 10 MG TABS tablet Take 1 tablet (10 mg total) by mouth daily. 90 tablet 0   furosemide (LASIX) 20 MG tablet Take 1 tablet (20 mg total) by mouth daily. 30 tablet 0   glucose blood (FREESTYLE LITE) test strip Check fasting blood sugar every morning and 2 hours after largest meal of the day. 100 each 12   Lancets (FREESTYLE) lancets Use to stick skin for testing. Check fasting blood sugar every morning and 2 hours after largest meal of the day. 100 each 12   Multiple Vitamin (MULTIVITAMIN) capsule Take 1 capsule by mouth daily.     olmesartan-hydrochlorothiazide (BENICAR HCT) 40-25 MG tablet TAKE 1 TABLET BY MOUTH ONCE DAILY 90 tablet 3   pantoprazole (PROTONIX) 40 MG tablet Take 1 tablet (40 mg total) by mouth daily. 90 tablet 3   Semaglutide, 1 MG/DOSE, (OZEMPIC, 1 MG/DOSE,) 4 MG/3ML SOPN Inject 1 mg into the skin once a week. 9 mL 0   venlafaxine XR (EFFEXOR XR) 75 MG 24 hr capsule Take 1 capsule (75 mg total) by mouth daily with breakfast. 90  capsule 3   fluticasone (FLONASE) 50 MCG/ACT nasal spray SHAKE LIQUID AND USE 2 SPRAYS IN EACH NOSTRIL DAILY (Patient not taking: Reported on 06/27/2023) 16 g 0   metoprolol tartrate (LOPRESSOR) 100 MG tablet TAKE 2 HOURS PRIOR TO CT SCAN (Patient not taking: Reported on 06/27/2023) 1 tablet 0   traMADol (ULTRAM) 50 MG tablet Take 1 tablet (50 mg total) by mouth every 8 (eight) hours as needed for moderate pain. (Patient not taking: Reported on 06/27/2023) 21 tablet 0   No current facility-administered medications for this visit.     Past Medical History:  Diagnosis Date   Anxiety    Diabetes mellitus without complication (HCC)    Hyperlipidemia    Hypertension    OSA (obstructive sleep apnea)     Past Surgical History:  Procedure Laterality Date   CESAREAN SECTION     CHOLECYSTECTOMY      Social History   Socioeconomic History   Marital status: Married    Spouse name: Kathlene November   Number of children: 2   Years of education: Not on file   Highest education level: 12th grade  Occupational History    Employer: [email or web address].    Comment: [place]  Tobacco Use   Smoking status: Former    Types: Cigarettes   Smokeless tobacco: Never   Tobacco comments:    Says smoked socially for about 3 years when younger  Substance and Sexual Activity   Alcohol use: Yes    Comment: rarely   Drug use: Yes   Sexual activity: Yes    Partners: Male  Other Topics Concern   Not on file  Social History Narrative   Some exercise.     Social Determinants of Health   Financial Resource Strain: Low Risk  (12/27/2022)   Overall Financial Resource Strain (CARDIA)    Difficulty of Paying Living Expenses: Not hard at all  Food Insecurity: No Food Insecurity (12/27/2022)   Hunger Vital Sign    Worried About Running Out of Food in the Last Year: Never true    Ran Out of Food in the Last Year: Never true  Transportation Needs: No Transportation Needs (12/27/2022)   PRAPARE - Therapist, art (Medical): No    Lack of Transportation (Non-Medical): No  Physical Activity: Insufficiently Active (12/27/2022)   Exercise Vital Sign    Days of Exercise per Week: 2 days    Minutes of Exercise per Session: 30 min  Stress: No Stress Concern Present (12/27/2022)   [employer] of Occupational Health - Occupational Stress Questionnaire    Feeling of Stress : Not at all  Social Connections: Unknown (12/27/2022)   Social Connection and Isolation Panel [NHANES]    Frequency of Communication with Friends and Family: More than three times a week    Frequency of Social Gatherings with Friends and Family: Twice a week    Attends Religious Services: Patient declined    Database administrator or Organizations: Yes    Attends Engineer, structural: More than 4 times per year    Marital Status: Married  Catering manager Violence: Not on file    Family History  Problem Relation Age of Onset   Heart attack Father    Hyperlipidemia Father    Hypertension Father    Diabetes Other        fam hx   Hyperlipidemia Other        fam hx   Hypertension Other        fam hx   Heart disease Other        fam hx    ROS: no fevers or chills, productive cough, hemoptysis, dysphasia, odynophagia, melena, hematochezia, dysuria, hematuria, rash, seizure activity, orthopnea, PND, pedal edema, claudication. Remaining systems are negative.  Physical Exam: Well-developed well-nourished in no acute distress.  Skin is warm and dry.  HEENT is normal.  Neck is supple.  Chest is clear to auscultation with normal expansion.  Cardiovascular exam is regular rate and rhythm.  Abdominal exam nontender or distended. No masses palpated. Extremities show no edema. neuro grossly intact  A/P  1 coronary artery disease-patient noted to have moderate stenosis in the mid LAD.  FFR borderline significant.  She is not having chest pain.  Feel medical therapy is best option.  Continue  aspirin and statin.  2 hypertension-blood pressure controlled.  Continue present medical regimen.  3 hyperlipidemia-continue Lipitor. Recent LPa 196.7; will refer for PCSK9I.  Can likely reduce dose of Lipitor when PCSK9 inhibitor initiated.  4 dyspnea-mild dyspnea unchanged.  5 history of obstructive sleep apnea-continue CPAP.  Olga Millers, MD

## 2023-06-15 ENCOUNTER — Other Ambulatory Visit: Payer: Self-pay | Admitting: Physician Assistant

## 2023-06-15 ENCOUNTER — Other Ambulatory Visit (HOSPITAL_BASED_OUTPATIENT_CLINIC_OR_DEPARTMENT_OTHER): Payer: Self-pay

## 2023-06-15 DIAGNOSIS — M7989 Other specified soft tissue disorders: Secondary | ICD-10-CM

## 2023-06-15 DIAGNOSIS — R0602 Shortness of breath: Secondary | ICD-10-CM

## 2023-06-19 DIAGNOSIS — G4733 Obstructive sleep apnea (adult) (pediatric): Secondary | ICD-10-CM | POA: Diagnosis not present

## 2023-06-19 LAB — HEPATIC FUNCTION PANEL
ALT: 17 IU/L (ref 0–32)
AST: 15 IU/L (ref 0–40)
Albumin: 4.3 g/dL (ref 3.8–4.9)
Alkaline Phosphatase: 86 [IU]/L (ref 44–121)
Bilirubin Total: 0.5 mg/dL (ref 0.0–1.2)
Bilirubin, Direct: 0.17 mg/dL (ref 0.00–0.40)
Total Protein: 6.8 g/dL (ref 6.0–8.5)

## 2023-06-19 LAB — LIPID PANEL
Chol/HDL Ratio: 2.3 {ratio} (ref 0.0–4.4)
Cholesterol, Total: 116 mg/dL (ref 100–199)
HDL: 51 mg/dL (ref >39)
LDL Chol Calc (NIH): 48 mg/dL (ref 0–99)
Triglycerides: 88 mg/dL (ref 0–149)
VLDL Cholesterol Cal: 17 mg/dL (ref 5–40)

## 2023-06-19 LAB — LIPOPROTEIN A (LPA): Lipoprotein (a): 196.7 nmol/L — ABNORMAL HIGH (ref <75.0)

## 2023-06-21 ENCOUNTER — Other Ambulatory Visit (HOSPITAL_BASED_OUTPATIENT_CLINIC_OR_DEPARTMENT_OTHER): Payer: Self-pay

## 2023-06-21 ENCOUNTER — Encounter (HOSPITAL_BASED_OUTPATIENT_CLINIC_OR_DEPARTMENT_OTHER): Payer: Self-pay

## 2023-06-22 ENCOUNTER — Encounter: Payer: Self-pay | Admitting: Dermatology

## 2023-06-23 ENCOUNTER — Ambulatory Visit: Payer: 59 | Admitting: Physician Assistant

## 2023-06-23 DIAGNOSIS — I10 Essential (primary) hypertension: Secondary | ICD-10-CM

## 2023-06-23 DIAGNOSIS — M7989 Other specified soft tissue disorders: Secondary | ICD-10-CM

## 2023-06-23 DIAGNOSIS — E1169 Type 2 diabetes mellitus with other specified complication: Secondary | ICD-10-CM

## 2023-06-23 DIAGNOSIS — G4733 Obstructive sleep apnea (adult) (pediatric): Secondary | ICD-10-CM

## 2023-06-26 ENCOUNTER — Ambulatory Visit: Payer: 59 | Admitting: Dermatology

## 2023-06-26 DIAGNOSIS — M25651 Stiffness of right hip, not elsewhere classified: Secondary | ICD-10-CM | POA: Diagnosis not present

## 2023-06-26 DIAGNOSIS — M546 Pain in thoracic spine: Secondary | ICD-10-CM | POA: Diagnosis not present

## 2023-06-26 DIAGNOSIS — M9906 Segmental and somatic dysfunction of lower extremity: Secondary | ICD-10-CM | POA: Diagnosis not present

## 2023-06-26 DIAGNOSIS — M9905 Segmental and somatic dysfunction of pelvic region: Secondary | ICD-10-CM | POA: Diagnosis not present

## 2023-06-26 DIAGNOSIS — M9901 Segmental and somatic dysfunction of cervical region: Secondary | ICD-10-CM | POA: Diagnosis not present

## 2023-06-26 DIAGNOSIS — M9903 Segmental and somatic dysfunction of lumbar region: Secondary | ICD-10-CM | POA: Diagnosis not present

## 2023-06-26 DIAGNOSIS — M545 Low back pain, unspecified: Secondary | ICD-10-CM | POA: Diagnosis not present

## 2023-06-26 DIAGNOSIS — M9902 Segmental and somatic dysfunction of thoracic region: Secondary | ICD-10-CM | POA: Diagnosis not present

## 2023-06-26 DIAGNOSIS — M7918 Myalgia, other site: Secondary | ICD-10-CM | POA: Diagnosis not present

## 2023-06-26 DIAGNOSIS — M542 Cervicalgia: Secondary | ICD-10-CM | POA: Diagnosis not present

## 2023-06-27 ENCOUNTER — Ambulatory Visit: Payer: 59 | Attending: Cardiology | Admitting: Cardiology

## 2023-06-27 ENCOUNTER — Encounter: Payer: Self-pay | Admitting: Cardiology

## 2023-06-27 VITALS — BP 100/70 | HR 85 | Ht 60.0 in | Wt 171.0 lb

## 2023-06-27 DIAGNOSIS — E7800 Pure hypercholesterolemia, unspecified: Secondary | ICD-10-CM

## 2023-06-27 DIAGNOSIS — R0602 Shortness of breath: Secondary | ICD-10-CM | POA: Diagnosis not present

## 2023-06-27 DIAGNOSIS — G4733 Obstructive sleep apnea (adult) (pediatric): Secondary | ICD-10-CM

## 2023-06-27 DIAGNOSIS — I2510 Atherosclerotic heart disease of native coronary artery without angina pectoris: Secondary | ICD-10-CM

## 2023-06-27 NOTE — Patient Instructions (Signed)
    Follow-Up: At [facility], you and your health needs are our priority.  As part of our continuing mission to provide you with exceptional heart care, we have created designated Provider Care Teams.  These Care Teams include your primary Cardiologist (physician) and Advanced Practice Providers (APPs -  Physician Assistants and Nurse Practitioners) who all work together to provide you with the care you need, when you need it.  We recommend signing up for the patient portal called "MyChart".  Sign up information is provided on this After Visit Summary.  MyChart is used to connect with patients for Virtual Visits (Telemedicine).  Patients are able to view lab/test results, encounter notes, upcoming appointments, etc.  Non-urgent messages can be sent to your provider as well.   To learn more about what you can do with MyChart, go to [email or web address].    Your next appointment:   6 month(s)  Provider:   Kirk Ruths MD

## 2023-06-30 ENCOUNTER — Ambulatory Visit: Payer: 59 | Admitting: Physician Assistant

## 2023-07-14 ENCOUNTER — Ambulatory Visit: Payer: 59 | Admitting: Physician Assistant

## 2023-07-17 ENCOUNTER — Ambulatory Visit (INDEPENDENT_AMBULATORY_CARE_PROVIDER_SITE_OTHER): Payer: 59 | Admitting: Physician Assistant

## 2023-07-17 ENCOUNTER — Other Ambulatory Visit (HOSPITAL_BASED_OUTPATIENT_CLINIC_OR_DEPARTMENT_OTHER): Payer: Self-pay

## 2023-07-17 ENCOUNTER — Other Ambulatory Visit: Payer: Self-pay

## 2023-07-17 VITALS — BP 110/73 | HR 98 | Ht 60.0 in | Wt 169.0 lb

## 2023-07-17 DIAGNOSIS — Z7985 Long-term (current) use of injectable non-insulin antidiabetic drugs: Secondary | ICD-10-CM | POA: Diagnosis not present

## 2023-07-17 DIAGNOSIS — E6609 Other obesity due to excess calories: Secondary | ICD-10-CM

## 2023-07-17 DIAGNOSIS — E66811 Obesity, class 1: Secondary | ICD-10-CM

## 2023-07-17 DIAGNOSIS — G4733 Obstructive sleep apnea (adult) (pediatric): Secondary | ICD-10-CM

## 2023-07-17 DIAGNOSIS — Z1231 Encounter for screening mammogram for malignant neoplasm of breast: Secondary | ICD-10-CM | POA: Diagnosis not present

## 2023-07-17 DIAGNOSIS — I10 Essential (primary) hypertension: Secondary | ICD-10-CM

## 2023-07-17 DIAGNOSIS — Z6834 Body mass index (BMI) 34.0-34.9, adult: Secondary | ICD-10-CM | POA: Diagnosis not present

## 2023-07-17 DIAGNOSIS — E1169 Type 2 diabetes mellitus with other specified complication: Secondary | ICD-10-CM | POA: Diagnosis not present

## 2023-07-17 DIAGNOSIS — R0602 Shortness of breath: Secondary | ICD-10-CM | POA: Diagnosis not present

## 2023-07-17 DIAGNOSIS — M7989 Other specified soft tissue disorders: Secondary | ICD-10-CM | POA: Diagnosis not present

## 2023-07-17 LAB — POCT GLYCOSYLATED HEMOGLOBIN (HGB A1C): Hemoglobin A1C: 6.2 % — AB (ref 4.0–5.6)

## 2023-07-17 MED ORDER — OZEMPIC (1 MG/DOSE) 4 MG/3ML ~~LOC~~ SOPN
1.0000 mg | PEN_INJECTOR | SUBCUTANEOUS | 0 refills | Status: DC
  Filled 2023-07-17: qty 9, 84d supply, fill #0

## 2023-07-17 MED ORDER — FUROSEMIDE 20 MG PO TABS
ORAL_TABLET | ORAL | 1 refills | Status: AC
Start: 2023-07-17 — End: ?
  Filled 2023-07-17: qty 30, 30d supply, fill #0

## 2023-07-17 MED ORDER — DAPAGLIFLOZIN PROPANEDIOL 10 MG PO TABS
10.0000 mg | ORAL_TABLET | Freq: Every day | ORAL | 0 refills | Status: DC
  Filled 2023-07-17: qty 90, 90d supply, fill #0

## 2023-07-17 NOTE — Progress Notes (Deleted)
   Established Patient Office Visit  Subjective   Patient ID: Kristen Bright, female    DOB: Aug 19, 1966  Age: 57 y.o. MRN: [id number]  Chief Complaint  Patient presents with   Medical Management of Chronic Issues    Fup on lasiks A1c  check d/m last  A1c was 6.0    HPI  {History (Optional):23778}  ROS    Objective:     BP 110/73   Pulse 98   Ht 5' (1.524 m)   Wt 169 lb (76.7 kg)   SpO2 99%   BMI 33.01 kg/m  {Vitals History (Optional):23777}  Physical Exam   Results for orders placed or performed in visit on 07/17/23  POCT HgB A1C  Result Value Ref Range   Hemoglobin A1C 6.2 (A) 4.0 - 5.6 %   HbA1c POC (<> result, manual entry)     HbA1c, POC (prediabetic range)     HbA1c, POC (controlled diabetic range)      {Labs (Optional):23779}  The ASCVD Risk score (Arnett DK, et al., 2019) failed to calculate for the following reasons:   The valid total cholesterol range is 130 to 320 mg/dL    Assessment & Plan:      Return in about 3 months (around 10/17/2023).    Tandy Gaw, PA-C

## 2023-07-17 NOTE — Progress Notes (Signed)
Established Patient Office Visit  Subjective   Patient ID: Kristen Bright, female    DOB: 1966/04/20  Age: 57 y.o. MRN: [id number]  Chief Complaint  Patient presents with   Medical Management of Chronic Issues    Fup on lasiks A1c  check d/m last  A1c was 6.0    HPI 57 yo female presents today for 3 month follow up. She has T2DM, OSA, HTN, GAD. She has been using lasix as needed and needs refill for Lasix. She recently saw cardiology who told her to take Lasix as needed. Her BP has been well controlled. She is not exercising "as much as she should" and her diet has not improved due to traveling. She is following up with cardio in 6 mo. She is overall feeling well and reports no symptoms. She is not checking her sugars. She takes ozempic and farxiga.   .. Active Ambulatory Problems    Diagnosis Date Noted   ELEVATED BLOOD PRESSURE WITHOUT DIAGNOSIS OF HYPERTENSION 11/09/2010   OSA (obstructive sleep apnea) 09/30/2013   Heart murmur 06/24/2014   Fatty liver disease, nonalcoholic 09/10/2014   Class 2 severe obesity due to excess calories with serious comorbidity and body mass index (BMI) of 35.0 to 35.9 in adult (HCC) 11/04/2014   Weight gain 11/04/2014   Stress 09/14/2015   GAD (generalized anxiety disorder) 09/14/2015   Essential hypertension, benign 09/14/2015   DDD (degenerative disc disease), cervical 08/18/2016   Mixed hyperlipidemia 01/18/2017   Memory changes 11/19/2017   Diabetes mellitus (HCC) 06/25/2019   Low serum potassium 06/26/2019   Gastroesophageal reflux disease 06/26/2019   History of COVID-19 09/23/2019   Throat congestion 02/16/2021   Actinic keratosis 10/05/2021   Menopausal symptoms 12/14/2021   Abdominal bloating 12/14/2021   Non-restorative sleep 05/20/2022   Epistaxis 12/28/2022   Recent skin changes 12/28/2022   Left tennis elbow 03/03/2023   Family history of ischemic heart disease 03/09/2023   Class 1 obesity due to excess calories without serious  comorbidity with body mass index (BMI) of 34.0 to 34.9 in adult 03/09/2023   Elevated coronary artery calcium score 03/09/2023   Swelling of lower extremity 04/17/2023   SOB (shortness of breath) on exertion 04/17/2023   Elevated blood pressure reading in office with diagnosis of hypertension 04/17/2023   Malaise and fatigue 04/18/2023   Orthopnea 04/18/2023   Mitral regurgitation 05/05/2023   Aortic regurgitation 05/05/2023   Resolved Ambulatory Problems    Diagnosis Date Noted   Anxiety state 02/28/2010   Episodic mood disorder (HCC) 11/09/2010   IFG (impaired fasting glucose) 05/28/2013   Anemia, iron deficiency 10/02/2013   Low iron stores 10/02/2013   BMI 34.0-34.9,adult 11/04/2014   Constipation 05/06/2015   Abnormal weight loss 05/06/2015   Past Medical History:  Diagnosis Date   Anxiety    Diabetes mellitus without complication (HCC)    Hyperlipidemia    Hypertension       Review of Systems  Respiratory:  Negative for shortness of breath.   Cardiovascular:  Negative for chest pain.      Objective:     BP 110/73   Pulse 98   Ht 5' (1.524 m)   Wt 169 lb (76.7 kg)   SpO2 99%   BMI 33.01 kg/m  BP Readings from Last 3 Encounters:  07/17/23 110/73  06/27/23 100/70  05/26/23 120/72    .Marland Kitchen Results for orders placed or performed in visit on 07/17/23  POCT HgB A1C  Result Value Ref  Range   Hemoglobin A1C 6.2 (A) 4.0 - 5.6 %   HbA1c POC (<> result, manual entry)     HbA1c, POC (prediabetic range)     HbA1c, POC (controlled diabetic range)       Physical Exam Constitutional:      Appearance: Normal appearance. She is obese.  HENT:     Head: Normocephalic.  Cardiovascular:     Rate and Rhythm: Normal rate and regular rhythm.     Pulses: Normal pulses.     Heart sounds: Normal heart sounds.  Pulmonary:     Effort: Pulmonary effort is normal.     Breath sounds: Normal breath sounds.  Musculoskeletal:     Cervical back: Normal range of motion and  neck supple.     Right lower leg: No edema.     Left lower leg: No edema.  Neurological:     General: No focal deficit present.     Mental Status: She is alert and oriented to person, place, and time.  Psychiatric:        Mood and Affect: Mood normal.           Assessment & Plan:  Marland KitchenMarland KitchenWaleska was seen today for medical management of chronic issues.  Diagnoses and all orders for this visit:  Type 2 diabetes mellitus with other specified complication, without long-term current use of insulin (HCC) -     Semaglutide, 1 MG/DOSE, (OZEMPIC, 1 MG/DOSE,) 4 MG/3ML SOPN; Inject 1 mg into the skin once a week. -     dapagliflozin propanediol (FARXIGA) 10 MG TABS tablet; Take 1 tablet (10 mg total) by mouth daily. -     POCT HgB A1C  OSA (obstructive sleep apnea)  Essential hypertension, benign  Swelling of lower extremity -     furosemide (LASIX) 20 MG tablet; Take one tablet as needed for edema.  SOB (shortness of breath) on exertion -     furosemide (LASIX) 20 MG tablet; Take one tablet as needed for edema.  Class 1 obesity due to excess calories with serious comorbidity and body mass index (BMI) of 34.0 to 34.9 in adult  Visit for screening mammogram -     MM 3D SCREENING MAMMOGRAM BILATERAL BREAST   BP to goal < 130/80 on Benicar HCT and Lasix.  Lasix PRN.  Discussed with patient significance of lipoprotein(a) elevation. A1c to goal < 6.5 on Farxiga and semaglutide.  Continue to improve diet and regular exercise 150 min/wk. Keep same medications. Continue using CPAP for OSA.  Recheck A1c in 3 mo.     Tandy Gaw, PA-C

## 2023-07-20 ENCOUNTER — Encounter: Payer: Self-pay | Admitting: Physician Assistant

## 2023-07-21 ENCOUNTER — Ambulatory Visit: Payer: 59

## 2023-07-21 ENCOUNTER — Other Ambulatory Visit (HOSPITAL_BASED_OUTPATIENT_CLINIC_OR_DEPARTMENT_OTHER): Payer: Self-pay

## 2023-07-21 MED ORDER — NIRMATRELVIR/RITONAVIR (PAXLOVID)TABLET
3.0000 | ORAL_TABLET | Freq: Two times a day (BID) | ORAL | 0 refills | Status: AC
  Filled 2023-07-21: qty 30, 5d supply, fill #0

## 2023-07-21 MED ORDER — NIRMATRELVIR/RITONAVIR (PAXLOVID)TABLET
3.0000 | ORAL_TABLET | Freq: Two times a day (BID) | ORAL | 0 refills | Status: DC
Start: 2023-07-21 — End: 2023-07-21

## 2023-07-21 NOTE — Addendum Note (Signed)
Addended by: Chalmers Cater on: 07/21/2023 11:51 AM   Modules accepted: Orders

## 2023-08-15 LAB — HM DIABETES EYE EXAM

## 2023-09-05 ENCOUNTER — Other Ambulatory Visit (HOSPITAL_BASED_OUTPATIENT_CLINIC_OR_DEPARTMENT_OTHER): Payer: Self-pay

## 2023-09-08 ENCOUNTER — Other Ambulatory Visit (HOSPITAL_BASED_OUTPATIENT_CLINIC_OR_DEPARTMENT_OTHER): Payer: Self-pay

## 2023-09-13 ENCOUNTER — Ambulatory Visit: Payer: 59

## 2023-09-13 DIAGNOSIS — Z1231 Encounter for screening mammogram for malignant neoplasm of breast: Secondary | ICD-10-CM

## 2023-09-15 NOTE — Progress Notes (Signed)
Normal mammogram. Follow up in one year.

## 2023-10-05 ENCOUNTER — Other Ambulatory Visit: Payer: Self-pay | Admitting: Physician Assistant

## 2023-10-05 ENCOUNTER — Other Ambulatory Visit (HOSPITAL_BASED_OUTPATIENT_CLINIC_OR_DEPARTMENT_OTHER): Payer: Self-pay

## 2023-10-05 DIAGNOSIS — E1169 Type 2 diabetes mellitus with other specified complication: Secondary | ICD-10-CM

## 2023-10-06 ENCOUNTER — Other Ambulatory Visit (HOSPITAL_BASED_OUTPATIENT_CLINIC_OR_DEPARTMENT_OTHER): Payer: Self-pay

## 2023-10-06 ENCOUNTER — Other Ambulatory Visit: Payer: Self-pay | Admitting: Physician Assistant

## 2023-10-06 DIAGNOSIS — E1169 Type 2 diabetes mellitus with other specified complication: Secondary | ICD-10-CM

## 2023-10-06 MED ORDER — DAPAGLIFLOZIN PROPANEDIOL 10 MG PO TABS
10.0000 mg | ORAL_TABLET | Freq: Every day | ORAL | 1 refills | Status: DC
  Filled 2023-10-06: qty 90, 90d supply, fill #0
  Filled 2024-03-26: qty 90, 90d supply, fill #1

## 2023-10-06 MED ORDER — OZEMPIC (1 MG/DOSE) 4 MG/3ML ~~LOC~~ SOPN
1.0000 mg | PEN_INJECTOR | SUBCUTANEOUS | 1 refills | Status: DC
  Filled 2023-10-06: qty 9, 84d supply, fill #0
  Filled 2023-12-25: qty 9, 84d supply, fill #1

## 2023-10-06 NOTE — Progress Notes (Signed)
 Pt doses ozempic  on Sunday. Refills sent.  UTD appt.

## 2023-11-23 ENCOUNTER — Other Ambulatory Visit: Payer: Self-pay | Admitting: Physician Assistant

## 2023-11-23 DIAGNOSIS — F439 Reaction to severe stress, unspecified: Secondary | ICD-10-CM

## 2023-11-23 DIAGNOSIS — N951 Menopausal and female climacteric states: Secondary | ICD-10-CM

## 2023-11-23 DIAGNOSIS — F411 Generalized anxiety disorder: Secondary | ICD-10-CM

## 2023-11-24 ENCOUNTER — Other Ambulatory Visit (HOSPITAL_BASED_OUTPATIENT_CLINIC_OR_DEPARTMENT_OTHER): Payer: Self-pay

## 2023-11-24 MED ORDER — VENLAFAXINE HCL ER 75 MG PO CP24
75.0000 mg | ORAL_CAPSULE | Freq: Every day | ORAL | 3 refills | Status: DC
  Filled 2023-11-24: qty 90, 90d supply, fill #0
  Filled 2024-02-20: qty 90, 90d supply, fill #1

## 2023-12-10 ENCOUNTER — Other Ambulatory Visit: Payer: Self-pay | Admitting: Physician Assistant

## 2023-12-10 DIAGNOSIS — I10 Essential (primary) hypertension: Secondary | ICD-10-CM

## 2023-12-11 ENCOUNTER — Other Ambulatory Visit: Payer: Self-pay

## 2023-12-11 ENCOUNTER — Other Ambulatory Visit (HOSPITAL_BASED_OUTPATIENT_CLINIC_OR_DEPARTMENT_OTHER): Payer: Self-pay

## 2023-12-11 MED ORDER — OLMESARTAN MEDOXOMIL-HCTZ 40-25 MG PO TABS
1.0000 | ORAL_TABLET | Freq: Every day | ORAL | 3 refills | Status: DC
  Filled 2023-12-11: qty 90, 90d supply, fill #0
  Filled 2024-03-26: qty 90, 90d supply, fill #1
  Filled 2024-07-07: qty 90, 90d supply, fill #2

## 2023-12-19 ENCOUNTER — Ambulatory Visit: Admitting: Physician Assistant

## 2023-12-20 ENCOUNTER — Ambulatory Visit: Admitting: Physician Assistant

## 2023-12-20 VITALS — BP 108/54 | HR 96 | Ht 60.0 in | Wt 168.0 lb

## 2023-12-20 DIAGNOSIS — R21 Rash and other nonspecific skin eruption: Secondary | ICD-10-CM | POA: Diagnosis not present

## 2023-12-20 DIAGNOSIS — L259 Unspecified contact dermatitis, unspecified cause: Secondary | ICD-10-CM | POA: Diagnosis not present

## 2023-12-20 MED ORDER — METHYLPREDNISOLONE SODIUM SUCC 125 MG IJ SOLR
125.0000 mg | Freq: Once | INTRAMUSCULAR | Status: AC
Start: 2023-12-20 — End: 2023-12-20
  Administered 2023-12-20: 125 mg via INTRAMUSCULAR

## 2023-12-20 NOTE — Progress Notes (Signed)
   Acute Office Visit  Subjective:     Patient ID: Kristen Bright, female    DOB: 11-18-1965, 58 y.o.   MRN: [id number]  Chief Complaint  Patient presents with   Rash    HPI Patient is in today for rash for 2 days on face, trunk, upper arms. She denies any URI symptoms. Rash is itchy. She has not been outside in yard. She has not started any new medications. She admits her husband changed detergent recently. No lip or tongue swelling. No cough or trouble breathing. Benadryl helps minimally.   ROS See HPI.      Objective:    BP (!) 108/54   Pulse 96   Ht 5' (1.524 m)   Wt 168 lb (76.2 kg)   SpO2 99%   BMI 32.81 kg/m  BP Readings from Last 3 Encounters:  12/20/23 (!) 108/54  07/17/23 110/73  06/27/23 100/70   Wt Readings from Last 3 Encounters:  12/20/23 168 lb (76.2 kg)  07/17/23 169 lb (76.7 kg)  06/27/23 171 lb (77.6 kg)      Physical Exam HENT:     Head: Normocephalic.  Cardiovascular:     Rate and Rhythm: Normal rate.     Pulses: Normal pulses.  Pulmonary:     Effort: Pulmonary effort is normal.  Skin:    Comments: Slight raised maculopapular rash on face diffusely more concentrated on forehead and temples.   Erythematous papules on chest and bilateral arms. Some excoriations over papules.   Neurological:     Mental Status: She is alert.  Psychiatric:        Mood and Affect: Mood normal.          Assessment & Plan:  Kristen Bright was seen today for rash.  Diagnoses and all orders for this visit:  Contact dermatitis, unspecified contact dermatitis type, unspecified trigger -     methylPREDNISolone  sodium succinate (SOLU-MEDROL ) 125 mg/2 mL injection 125 mg  Rash -     methylPREDNISolone  sodium succinate (SOLU-MEDROL ) 125 mg/2 mL injection 125 mg   Unknown trigger husband did change detergents Solumedrol IM today in office Benadryl as needed for itching Cool compresses follow up if rash or symptoms worsening or not improving   Sandy Crumb,  PA-C

## 2023-12-26 ENCOUNTER — Other Ambulatory Visit (HOSPITAL_BASED_OUTPATIENT_CLINIC_OR_DEPARTMENT_OTHER): Payer: Self-pay

## 2024-01-01 ENCOUNTER — Ambulatory Visit: Admitting: Physician Assistant

## 2024-01-03 NOTE — Progress Notes (Signed)
 HPI: FU coronary artery disease. Echocardiogram September 2024 showed normal LV function, mild aortic insufficiency.  Coronary CTA September 2024 showed Ca score 325 (98 percentile); moderate stenosis mid LAD (50-69%) and otherwise mild disease.  FFR 0.85 in the mid LAD and 0.78 in the distal LAD.  Since last seen the patient has dyspnea with more extreme activities but not with routine activities. It is relieved with rest. It is not associated with chest pain. There is no orthopnea, PND or pedal edema. There is no syncope or palpitations. There is no exertional chest pain.   Current Outpatient Medications  Medication Sig Dispense Refill   aspirin 81 MG chewable tablet Chew 81 mg by mouth daily.     atorvastatin  (LIPITOR) 80 MG tablet Take 1 tablet (80 mg total) by mouth daily. 90 tablet 3   Blood Glucose Monitoring Suppl (FREESTYLE LITE) w/Device KIT Check fasting blood sugar every morning and 2 hours after largest meal of the day. 1 kit PRN   dapagliflozin  propanediol (FARXIGA ) 10 MG TABS tablet Take 1 tablet (10 mg total) by mouth daily. 90 tablet 1   furosemide  (LASIX ) 20 MG tablet Take one tablet as needed for edema. 30 tablet 1   glucose blood (FREESTYLE LITE) test strip Check fasting blood sugar every morning and 2 hours after largest meal of the day. 100 each 12   Lancets (FREESTYLE) lancets Use to stick skin for testing. Check fasting blood sugar every morning and 2 hours after largest meal of the day. 100 each 12   Multiple Vitamin (MULTIVITAMIN) capsule Take 1 capsule by mouth daily.     olmesartan -hydrochlorothiazide  (BENICAR  HCT) 40-25 MG tablet Take 1 tablet by mouth daily. 90 tablet 3   pantoprazole  (PROTONIX ) 40 MG tablet Take 1 tablet (40 mg total) by mouth daily. 90 tablet 3   Semaglutide , 1 MG/DOSE, (OZEMPIC , 1 MG/DOSE,) 4 MG/3ML SOPN Inject 1 mg into the skin once a week. 9 mL 1   venlafaxine  XR (EFFEXOR  XR) 75 MG 24 hr capsule Take 1 capsule (75 mg total) by mouth daily  with breakfast. 90 capsule 3   No current facility-administered medications for this visit.     Past Medical History:  Diagnosis Date   Anxiety    Diabetes mellitus without complication (HCC)    Hyperlipidemia    Hypertension    OSA (obstructive sleep apnea)     Past Surgical History:  Procedure Laterality Date   CESAREAN SECTION     CHOLECYSTECTOMY      Social History   Socioeconomic History   Marital status: Married    Spouse name: Athena Bland   Number of children: 2   Years of education: Not on file   Highest education level: 12th grade  Occupational History    Employer: [email or web address].    Comment: [place]  Tobacco Use   Smoking status: Former    Types: Cigarettes   Smokeless tobacco: Never   Tobacco comments:    Says smoked socially for about 3 years when younger  Substance and Sexual Activity   Alcohol use: Yes    Comment: rarely   Drug use: Yes   Sexual activity: Yes    Partners: Male  Other Topics Concern   Not on file  Social History Narrative   Some exercise.     Social Drivers of Health   Financial Resource Strain: Low Risk  (12/20/2023)   Overall Financial Resource Strain (CARDIA)    Difficulty of Paying Living Expenses:  Not hard at all  Food Insecurity: No Food Insecurity (12/20/2023)   Hunger Vital Sign    Worried About Running Out of Food in the Last Year: Never true    Ran Out of Food in the Last Year: Never true  Transportation Needs: No Transportation Needs (12/20/2023)   PRAPARE - Administrator, Civil Service (Medical): No    Lack of Transportation (Non-Medical): No  Physical Activity: Insufficiently Active (12/20/2023)   Exercise Vital Sign    Days of Exercise per Week: 2 days    Minutes of Exercise per Session: 20 min  Stress: No Stress Concern Present (12/20/2023)   [employer] of Occupational Health - Occupational Stress Questionnaire    Feeling of Stress : Not at all  Social Connections: Socially Integrated  (12/20/2023)   Social Connection and Isolation Panel [NHANES]    Frequency of Communication with Friends and Family: More than three times a week    Frequency of Social Gatherings with Friends and Family: Once a week    Attends Religious Services: 1 to 4 times per year    Active Member of [employer] or Organizations: Yes    Attends Banker Meetings: 1 to 4 times per year    Marital Status: Married  Catering manager Violence: Not on file    Family History  Problem Relation Age of Onset   Heart attack Father    Hyperlipidemia Father    Hypertension Father    Diabetes Other        fam hx   Hyperlipidemia Other        fam hx   Hypertension Other        fam hx   Heart disease Other        fam hx    ROS: no fevers or chills, productive cough, hemoptysis, dysphasia, odynophagia, melena, hematochezia, dysuria, hematuria, rash, seizure activity, orthopnea, PND, pedal edema, claudication. Remaining systems are negative.  Physical Exam: Well-developed well-nourished in no acute distress.  Skin is warm and dry.  HEENT is normal.  Neck is supple.  Chest is clear to auscultation with normal expansion.  Cardiovascular exam is regular rate and rhythm.  Abdominal exam nontender or distended. No masses palpated. Extremities show no edema. neuro grossly intact  EKG Interpretation Date/Time:  Wednesday Jan 17 2024 11:58:47 EDT Ventricular Rate:  92 PR Interval:  146 QRS Duration:  76 QT Interval:  356 QTC Calculation: 440 R Axis:   78  Text Interpretation: Normal sinus rhythm Normal ECG Confirmed by Alexandria Angel ([id number]) on 01/17/2024 11:59:52 AM    A/P  1 coronary artery disease-moderate on previous CTA and FFR borderline significant.  She continues to do well with no significant symptoms.  Plan to continue aspirin and statin.  Can consider cardiac catheterization if she develops chest pain in the future.  2 hyperlipidemia-previous LP(a) 196.7. Last LDL 48. Continue  Lipitor.  Note she is participating in a study concerning the medication to lower LP(a).  She will forward us  the records.  3 hypertension-blood pressure controlled.  Continue present medications.  4 history of obstructive sleep apnea-continue CPAP.  Alexandria Angel, MD

## 2024-01-17 ENCOUNTER — Ambulatory Visit: Payer: 59 | Attending: Cardiology | Admitting: Cardiology

## 2024-01-17 ENCOUNTER — Encounter: Payer: Self-pay | Admitting: Cardiology

## 2024-01-17 VITALS — BP 118/60 | HR 92 | Ht 60.0 in | Wt 184.0 lb

## 2024-01-17 DIAGNOSIS — I10 Essential (primary) hypertension: Secondary | ICD-10-CM

## 2024-01-17 DIAGNOSIS — I2510 Atherosclerotic heart disease of native coronary artery without angina pectoris: Secondary | ICD-10-CM

## 2024-01-17 DIAGNOSIS — R072 Precordial pain: Secondary | ICD-10-CM

## 2024-01-17 DIAGNOSIS — E7800 Pure hypercholesterolemia, unspecified: Secondary | ICD-10-CM

## 2024-01-17 NOTE — Patient Instructions (Signed)

## 2024-02-20 ENCOUNTER — Other Ambulatory Visit (HOSPITAL_BASED_OUTPATIENT_CLINIC_OR_DEPARTMENT_OTHER): Payer: Self-pay

## 2024-03-06 ENCOUNTER — Other Ambulatory Visit (HOSPITAL_BASED_OUTPATIENT_CLINIC_OR_DEPARTMENT_OTHER): Payer: Self-pay

## 2024-03-06 ENCOUNTER — Other Ambulatory Visit: Payer: Self-pay | Admitting: Physician Assistant

## 2024-03-06 DIAGNOSIS — E782 Mixed hyperlipidemia: Secondary | ICD-10-CM

## 2024-03-06 DIAGNOSIS — R931 Abnormal findings on diagnostic imaging of heart and coronary circulation: Secondary | ICD-10-CM

## 2024-03-06 DIAGNOSIS — Z8249 Family history of ischemic heart disease and other diseases of the circulatory system: Secondary | ICD-10-CM

## 2024-03-06 MED ORDER — ATORVASTATIN CALCIUM 80 MG PO TABS
80.0000 mg | ORAL_TABLET | Freq: Every day | ORAL | 3 refills | Status: AC
  Filled 2024-03-06 – 2024-03-19 (×2): qty 90, 90d supply, fill #0
  Filled 2024-07-07: qty 90, 90d supply, fill #1
  Filled 2024-10-03: qty 90, 90d supply, fill #2

## 2024-03-19 ENCOUNTER — Other Ambulatory Visit (HOSPITAL_BASED_OUTPATIENT_CLINIC_OR_DEPARTMENT_OTHER): Payer: Self-pay

## 2024-03-24 ENCOUNTER — Other Ambulatory Visit: Payer: Self-pay | Admitting: Physician Assistant

## 2024-03-24 DIAGNOSIS — E1169 Type 2 diabetes mellitus with other specified complication: Secondary | ICD-10-CM

## 2024-03-25 ENCOUNTER — Other Ambulatory Visit (HOSPITAL_BASED_OUTPATIENT_CLINIC_OR_DEPARTMENT_OTHER): Payer: Self-pay

## 2024-03-25 MED ORDER — OZEMPIC (1 MG/DOSE) 4 MG/3ML ~~LOC~~ SOPN
1.0000 mg | PEN_INJECTOR | SUBCUTANEOUS | 1 refills | Status: DC
Start: 2024-03-25 — End: 2024-04-19
  Filled 2024-03-25: qty 9, 84d supply, fill #0

## 2024-03-26 ENCOUNTER — Other Ambulatory Visit (HOSPITAL_BASED_OUTPATIENT_CLINIC_OR_DEPARTMENT_OTHER): Payer: Self-pay

## 2024-04-19 ENCOUNTER — Encounter: Payer: Self-pay | Admitting: Physician Assistant

## 2024-04-19 ENCOUNTER — Ambulatory Visit (INDEPENDENT_AMBULATORY_CARE_PROVIDER_SITE_OTHER): Admitting: Physician Assistant

## 2024-04-19 ENCOUNTER — Other Ambulatory Visit (HOSPITAL_BASED_OUTPATIENT_CLINIC_OR_DEPARTMENT_OTHER): Payer: Self-pay

## 2024-04-19 ENCOUNTER — Ambulatory Visit: Payer: Self-pay | Admitting: Physician Assistant

## 2024-04-19 VITALS — BP 100/60 | HR 91 | Ht 60.0 in | Wt 185.6 lb

## 2024-04-19 DIAGNOSIS — I10 Essential (primary) hypertension: Secondary | ICD-10-CM | POA: Diagnosis not present

## 2024-04-19 DIAGNOSIS — Z6835 Body mass index (BMI) 35.0-35.9, adult: Secondary | ICD-10-CM | POA: Diagnosis not present

## 2024-04-19 DIAGNOSIS — E66812 Obesity, class 2: Secondary | ICD-10-CM | POA: Diagnosis not present

## 2024-04-19 DIAGNOSIS — E782 Mixed hyperlipidemia: Secondary | ICD-10-CM | POA: Diagnosis not present

## 2024-04-19 DIAGNOSIS — G4733 Obstructive sleep apnea (adult) (pediatric): Secondary | ICD-10-CM

## 2024-04-19 DIAGNOSIS — Z7984 Long term (current) use of oral hypoglycemic drugs: Secondary | ICD-10-CM | POA: Diagnosis not present

## 2024-04-19 DIAGNOSIS — Z0000 Encounter for general adult medical examination without abnormal findings: Secondary | ICD-10-CM | POA: Diagnosis not present

## 2024-04-19 DIAGNOSIS — F411 Generalized anxiety disorder: Secondary | ICD-10-CM

## 2024-04-19 DIAGNOSIS — E1169 Type 2 diabetes mellitus with other specified complication: Secondary | ICD-10-CM | POA: Diagnosis not present

## 2024-04-19 LAB — POCT GLYCOSYLATED HEMOGLOBIN (HGB A1C): Hemoglobin A1C: 7.1 % — AB (ref 4.0–5.6)

## 2024-04-19 LAB — POCT UA - MICROALBUMIN
Albumin/Creatinine Ratio, Urine, POC: <30
Creatinine, POC: 200 mg/dL
Microalbumin Ur, POC: 30 mg/L

## 2024-04-19 MED ORDER — DAPAGLIFLOZIN PROPANEDIOL 10 MG PO TABS
10.0000 mg | ORAL_TABLET | Freq: Every day | ORAL | 1 refills | Status: DC
  Filled 2024-04-19: qty 90, 90d supply, fill #0

## 2024-04-19 MED ORDER — TIRZEPATIDE 5 MG/0.5ML ~~LOC~~ SOAJ
5.0000 mg | SUBCUTANEOUS | 0 refills | Status: DC
  Filled 2024-04-19: qty 2, 28d supply, fill #0

## 2024-04-19 MED ORDER — VENLAFAXINE HCL ER 150 MG PO CP24
150.0000 mg | ORAL_CAPSULE | Freq: Every day | ORAL | 1 refills | Status: DC
  Filled 2024-04-19: qty 90, 90d supply, fill #0
  Filled 2024-08-30: qty 90, 90d supply, fill #1

## 2024-04-19 NOTE — Patient Instructions (Addendum)
 NEED: shingles and pneumonia vaccine  Health Maintenance, Female Adopting a healthy lifestyle and getting preventive care are important in promoting health and wellness. Ask your health care provider about: The right schedule for you to have regular tests and exams. Things you can do on your own to prevent diseases and keep yourself healthy. What should I know about diet, weight, and exercise? Eat a healthy diet  Eat a diet that includes plenty of vegetables, fruits, low-fat dairy products, and lean protein. Do not eat a lot of foods that are high in solid fats, added sugars, or sodium. Maintain a healthy weight Body mass index (BMI) is used to identify weight problems. It estimates body fat based on height and weight. Your health care provider can help determine your BMI and help you achieve or maintain a healthy weight. Get regular exercise Get regular exercise. This is one of the most important things you can do for your health. Most adults should: Exercise for at least 150 minutes each week. The exercise should increase your heart rate and make you sweat (moderate-intensity exercise). Do strengthening exercises at least twice a week. This is in addition to the moderate-intensity exercise. Spend less time sitting. Even light physical activity can be beneficial. Watch cholesterol and blood lipids Have your blood tested for lipids and cholesterol at 57 years of age, then have this test every 5 years. Have your cholesterol levels checked more often if: Your lipid or cholesterol levels are high. You are older than 57 years of age. You are at high risk for heart disease. What should I know about cancer screening? Depending on your health history and family history, you may need to have cancer screening at various ages. This may include screening for: Breast cancer. Cervical cancer. Colorectal cancer. Skin cancer. Lung cancer. What should I know about heart disease, diabetes, and high  blood pressure? Blood pressure and heart disease High blood pressure causes heart disease and increases the risk of stroke. This is more likely to develop in people who have high blood pressure readings or are overweight. Have your blood pressure checked: Every 3-5 years if you are 16-42 years of age. Every year if you are 64 years old or older. Diabetes Have regular diabetes screenings. This checks your fasting blood sugar level. Have the screening done: Once every three years after age 40 if you are at a normal weight and have a low risk for diabetes. More often and at a younger age if you are overweight or have a high risk for diabetes. What should I know about preventing infection? Hepatitis B If you have a higher risk for hepatitis B, you should be screened for this virus. Talk with your health care provider to find out if you are at risk for hepatitis B infection. Hepatitis C Testing is recommended for: Everyone born from [birth year] through 1964. Anyone with known risk factors for hepatitis C. Sexually transmitted infections (STIs) Get screened for STIs, including gonorrhea and chlamydia, if: You are sexually active and are younger than 57 years of age. You are older than 57 years of age and your health care provider tells you that you are at risk for this type of infection. Your sexual activity has changed since you were last screened, and you are at increased risk for chlamydia or gonorrhea. Ask your health care provider if you are at risk. Ask your health care provider about whether you are at high risk for HIV. Your health care provider may recommend a  prescription medicine to help prevent HIV infection. If you choose to take medicine to prevent HIV, you should first get tested for HIV. You should then be tested every 3 months for as long as you are taking the medicine. Pregnancy If you are about to stop having your period (premenopausal) and you may become pregnant, seek counseling  before you get pregnant. Take 400 to 800 micrograms (mcg) of folic acid every day if you become pregnant. Ask for birth control (contraception) if you want to prevent pregnancy. Osteoporosis and menopause Osteoporosis is a disease in which the bones lose minerals and strength with aging. This can result in bone fractures. If you are 78 years old or older, or if you are at risk for osteoporosis and fractures, ask your health care provider if you should: Be screened for bone loss. Take a calcium  or vitamin D  supplement to lower your risk of fractures. Be given hormone replacement therapy (HRT) to treat symptoms of menopause. Follow these instructions at home: Alcohol use Do not drink alcohol if: Your health care provider tells you not to drink. You are pregnant, may be pregnant, or are planning to become pregnant. If you drink alcohol: Limit how much you have to: 0-1 drink a day. Know how much alcohol is in your drink. In the [place] one drink equals one 12 oz bottle of beer (355 mL), one 5 oz glass of wine (148 mL), or one 1 oz glass of hard liquor (44 mL). Lifestyle Do not use any products that contain nicotine or tobacco. These products include cigarettes, chewing tobacco, and vaping devices, such as e-cigarettes. If you need help quitting, ask your health care provider. Do not use street drugs. Do not share needles. Ask your health care provider for help if you need support or information about quitting drugs. General instructions Schedule regular health, dental, and eye exams. Stay current with your vaccines. Tell your health care provider if: You often feel depressed. You have ever been abused or do not feel safe at home. Summary Adopting a healthy lifestyle and getting preventive care are important in promoting health and wellness. Follow your health care provider's instructions about healthy diet, exercising, and getting tested or screened for diseases. Follow your health care  provider's instructions on monitoring your cholesterol and blood pressure. This information is not intended to replace advice given to you by your health care provider. Make sure you discuss any questions you have with your health care provider. Document Revised: 01/04/2021 Document Reviewed: 01/04/2021 Elsevier Patient Education  2024 [employer].

## 2024-04-19 NOTE — Progress Notes (Signed)
 No abnormal protein.  A1C is up some we are switching to mounjaro .

## 2024-04-22 ENCOUNTER — Encounter: Payer: Self-pay | Admitting: Physician Assistant

## 2024-04-22 NOTE — Progress Notes (Addendum)
 Complete physical exam  Patient: Kristen Bright   DOB: 1966-08-07   58 y.o. Female  MRN: [id number]  Subjective:    Chief Complaint  Patient presents with   Annual Exam    Kristen Bright is a 58 y.o. female who presents today for a complete physical exam. She reports consuming a general diet. Pt is walking a few times a week.  She generally feels well. She reports sleeping well. She does have additional problems to discuss today. She is frustrated with her weight. She would like referral to healthy weight and wellness within [facility]    Most recent fall risk assessment:    04/19/2024    1:12 PM  Fall Risk   Falls in the past year? 0  Number falls in past yr: 0  Injury with Fall? 0     Most recent depression screenings:    04/19/2024    1:12 PM 12/28/2022    7:31 AM  PHQ 2/9 Scores  PHQ - 2 Score 0 0  PHQ- 9 Score 0     Vision:Within last year and Dental: No current dental problems and Receives regular dental care  Patient Active Problem List   Diagnosis Date Noted   Mitral regurgitation 05/05/2023   Aortic regurgitation 05/05/2023   Malaise and fatigue 04/18/2023   Orthopnea 04/18/2023   Swelling of lower extremity 04/17/2023   SOB (shortness of breath) on exertion 04/17/2023   Elevated blood pressure reading in office with diagnosis of hypertension 04/17/2023   Family history of ischemic heart disease 03/09/2023   Class 1 obesity due to excess calories without serious comorbidity with body mass index (BMI) of 34.0 to 34.9 in adult 03/09/2023   Elevated coronary artery calcium  score 03/09/2023   Left tennis elbow 03/03/2023   Epistaxis 12/28/2022   Recent skin changes 12/28/2022   Non-restorative sleep 05/20/2022   Menopausal symptoms 12/14/2021   Abdominal bloating 12/14/2021   Actinic keratosis 10/05/2021   Throat congestion 02/16/2021   History of COVID-19 09/23/2019   Low serum potassium 06/26/2019   Gastroesophageal reflux disease 06/26/2019   Diabetes  mellitus (HCC) 06/25/2019   Memory changes 11/19/2017   Mixed hyperlipidemia 01/18/2017   DDD (degenerative disc disease), cervical 08/18/2016   Stress 09/14/2015   GAD (generalized anxiety disorder) 09/14/2015   Essential hypertension, benign 09/14/2015   Class 2 severe obesity due to excess calories with serious comorbidity and body mass index (BMI) of 35.0 to 35.9 in adult (HCC) 11/04/2014   Weight gain 11/04/2014   Fatty liver disease, nonalcoholic 09/10/2014   Heart murmur 06/24/2014   OSA (obstructive sleep apnea) 09/30/2013   ELEVATED BLOOD PRESSURE WITHOUT DIAGNOSIS OF HYPERTENSION 11/09/2010   Past Medical History:  Diagnosis Date   Anxiety    Arthritis    Diabetes mellitus without complication (HCC)    GERD (gastroesophageal reflux disease)    Heart murmur    Hyperlipidemia    Hypertension    OSA (obstructive sleep apnea)    Sleep apnea    Past Surgical History:  Procedure Laterality Date   CESAREAN SECTION     CHOLECYSTECTOMY     Family Status  Relation Name Status   Mother Bruna Alive   Father Ed Deceased   Other  (Not Specified)   Other  (Not Specified)   Other  (Not Specified)   Other cardiovascular disorder (Not Specified)   Brother India Alive   Brother Vinie Alive  No partnership data on file   Family History  Problem Relation Age of Onset   Anxiety disorder Mother    Depression Mother    Obesity Mother    Heart attack Father    Hyperlipidemia Father    Hypertension Father    Anxiety disorder Father    Depression Father    Diabetes Father    Heart disease Father    Diabetes Other        fam hx   Hyperlipidemia Other        fam hx   Hypertension Other        fam hx   Heart disease Other        fam hx   Anxiety disorder Brother    Depression Brother    Allergies  Allergen Reactions   Lisinopril      Swelling  Per patient history       Patient Care Team: Kassia Demarinis L, PA-C as PCP - General (Family Medicine) Johnnye Ade,  MD as PCP - OBGYN (Obstetrics and Gynecology)   Outpatient Medications Prior to Visit  Medication Sig   aspirin 81 MG chewable tablet Chew 81 mg by mouth daily.   atorvastatin  (LIPITOR) 80 MG tablet Take 1 tablet (80 mg total) by mouth daily.   Blood Glucose Monitoring Suppl (FREESTYLE LITE) w/Device KIT Check fasting blood sugar every morning and 2 hours after largest meal of the day.   furosemide  (LASIX ) 20 MG tablet Take one tablet as needed for edema.   glucose blood (FREESTYLE LITE) test strip Check fasting blood sugar every morning and 2 hours after largest meal of the day.   Lancets (FREESTYLE) lancets Use to stick skin for testing. Check fasting blood sugar every morning and 2 hours after largest meal of the day.   Multiple Vitamin (MULTIVITAMIN) capsule Take 1 capsule by mouth daily.   olmesartan -hydrochlorothiazide  (BENICAR  HCT) 40-25 MG tablet Take 1 tablet by mouth daily.   pantoprazole  (PROTONIX ) 40 MG tablet Take 1 tablet (40 mg total) by mouth daily.   [DISCONTINUED] dapagliflozin  propanediol (FARXIGA ) 10 MG TABS tablet Take 1 tablet (10 mg total) by mouth daily.   [DISCONTINUED] Semaglutide , 1 MG/DOSE, (OZEMPIC , 1 MG/DOSE,) 4 MG/3ML SOPN Inject 1 mg into the skin once a week.   [DISCONTINUED] venlafaxine  XR (EFFEXOR  XR) 75 MG 24 hr capsule Take 1 capsule (75 mg total) by mouth daily with breakfast.   No facility-administered medications prior to visit.    Review of Systems  All other systems reviewed and are negative.         Objective:     BP 100/60   Pulse 91   Ht 5' (1.524 m)   Wt 185 lb 9.6 oz (84.2 kg)   SpO2 99%   BMI 36.25 kg/m  BP Readings from Last 3 Encounters:  04/19/24 100/60  01/17/24 118/60  12/20/23 (!) 108/54   Wt Readings from Last 3 Encounters:  04/19/24 185 lb 9.6 oz (84.2 kg)  01/17/24 184 lb (83.5 kg)  12/20/23 168 lb (76.2 kg)      Physical Exam  BP 100/60   Pulse 91   Ht 5' (1.524 m)   Wt 185 lb 9.6 oz (84.2 kg)   SpO2 99%    BMI 36.25 kg/m   General Appearance:    Alert, cooperative, obese no distress, appears stated age  Head:    Normocephalic, without obvious abnormality, atraumatic  Eyes:    PERRL, conjunctiva/corneas clear, EOM's intact, fundi    benign, both eyes  Ears:    Normal  TM's and external ear canals, both ears  Nose:   Nares normal, septum midline, mucosa normal, no drainage    or sinus tenderness  Throat:   Lips, mucosa, and tongue normal; teeth and gums normal  Neck:   Supple, symmetrical, trachea midline, no adenopathy;    thyroid :  no enlargement/tenderness/nodules; no carotid   bruit or JVD  Back:     Symmetric, no curvature, ROM normal, no CVA tenderness  Lungs:     Clear to auscultation bilaterally, respirations unlabored  Chest Wall:    No tenderness or deformity   Heart:    Regular rate and rhythm, S1 and S2 normal, no murmur, rub   or gallop     Abdomen:     Soft, non-tender, bowel sounds active all four quadrants,    no masses, no organomegaly        Extremities:   Extremities normal, atraumatic, no cyanosis or edema  Pulses:   2+ and symmetric all extremities  Skin:   Skin color, texture, turgor normal, no rashes or lesions  Lymph nodes:   Cervical, supraclavicular, and axillary nodes normal  Neurologic:   CNII-XII intact, normal strength, sensation and reflexes    throughout    Results for orders placed or performed in visit on 04/19/24  POCT HgB A1C  Result Value Ref Range   Hemoglobin A1C 7.1 (A) 4.0 - 5.6 %   HbA1c POC (<> result, manual entry)     HbA1c, POC (prediabetic range)     HbA1c, POC (controlled diabetic range)    POCT UA - Microalbumin  Result Value Ref Range   Microalbumin Ur, POC 30 mg/L   Creatinine, POC 200 mg/dL   Albumin/Creatinine Ratio, Urine, POC <30        Assessment & Plan:    Routine Health Maintenance and Physical Exam  Immunization History  Administered Date(s) Administered   Influenza Split 06/29/2012   Influenza Whole  05/29/2010   Influenza,inj,Quad PF,6+ Mos 05/27/2014, 05/30/2015   Influenza-Unspecified 06/06/2016, 05/31/2017, 05/29/2018, 06/03/2020   PFIZER(Purple Top)SARS-COV-2 Vaccination 09/03/2019, 09/24/2019, 07/28/2020, 09/29/2020   Td 08/29/2009   Tdap 02/16/2021    Health Maintenance  Topic Date Due   Diabetic kidney evaluation - eGFR measurement  04/16/2024   COVID-19 Vaccine (5 - 2024-25 season) 05/05/2024 (Originally 04/30/2023)   HIV Screening  07/16/2024 (Originally 02/03/1982)   Zoster Vaccines- Shingrix (1 of 2) 07/20/2024 (Originally 02/03/1986)   INFLUENZA VACCINE  11/26/2024 (Originally 03/29/2024)   Pneumococcal Vaccine: 50+ Years (1 of 2 - PCV) 04/19/2025 (Originally 02/03/1986)   Hepatitis B Vaccines 19-59 Average Risk (1 of 3 - 19+ 3-dose series) 04/19/2025 (Originally 02/03/1986)   OPHTHALMOLOGY EXAM  08/14/2024   HEMOGLOBIN A1C  10/20/2024   Diabetic kidney evaluation - Urine ACR  04/19/2025   MAMMOGRAM  09/12/2025   Cervical Cancer Screening (HPV/Pap Cotest)  10/05/2026   Colonoscopy  07/02/2028   DTaP/Tdap/Td (3 - Td or Tdap) 02/17/2031   Hepatitis C Screening  Completed   HPV VACCINES  Aged Out   Meningococcal B Vaccine  Aged Out   FOOT EXAM  Discontinued    Discussed health benefits of physical activity, and encouraged her to engage in regular exercise appropriate for her age and condition.  Kristen Bright was seen today for annual exam.  Diagnoses and all orders for this visit:  Routine physical examination -     CBC with Differential/Platelet -     CMP14+EGFR -     Lipid panel -  TSH -     VITAMIN D  25 Hydroxy (Vit-D Deficiency, Fractures) -     B12 and Folate Panel  Type 2 diabetes mellitus with other specified complication, without long-term current use of insulin (HCC) -     CBC with Differential/Platelet -     CMP14+EGFR -     Lipid panel -     TSH -     POCT HgB A1C -     POCT UA - Microalbumin -     VITAMIN D  25 Hydroxy (Vit-D Deficiency, Fractures) -      B12 and Folate Panel -     dapagliflozin  propanediol (FARXIGA ) 10 MG TABS tablet; Take 1 tablet (10 mg total) by mouth daily. -     tirzepatide  (MOUNJARO ) 5 MG/0.5ML Pen; Inject 5 mg into the skin once a week.  Mixed hyperlipidemia -     Lipid panel  Essential hypertension, benign -     CMP14+EGFR  GAD (generalized anxiety disorder) -     venlafaxine  XR (EFFEXOR  XR) 150 MG 24 hr capsule; Take 1 capsule (150 mg total) by mouth daily with breakfast.  Class 2 severe obesity due to excess calories with serious comorbidity and body mass index (BMI) of 35.0 to 35.9 in adult (HCC) -     Amb Ref to Medical Weight Management  OSA (obstructive sleep apnea)   .SABRA Discussed 150 minutes of exercise a week.  Encouraged vitamin D  1000 units and Calcium  1300mg  or 4 servings of dairy a day.  Fasting labs ordered today No falls PHQ no concerns Pt does admit to being more anxious and would like increase on her effexor  Her husband has been sick and both of her daughters are pregnant A1C not to goal Would like to switch to mounjaro  for better A1C control Stop ozempic  and start mounjaro  5mg  Continue farxiga  Foot and eye exam UTD BP to goal, on ARB.  On statin Follow up in 3 months Discussed need for shingles and pneumonia, pt declines today.   Referral made to help with weight loss.    Return in about 3 months (around 07/20/2024).     Khadim Lundberg, PA-C

## 2024-04-22 NOTE — Addendum Note (Signed)
 Addended by: ANTONIETTE VERMELL CROME on: 04/22/2024 11:37 AM   Modules accepted: Orders

## 2024-04-23 ENCOUNTER — Encounter: Payer: Self-pay | Admitting: Physician Assistant

## 2024-04-25 DIAGNOSIS — Z0000 Encounter for general adult medical examination without abnormal findings: Secondary | ICD-10-CM | POA: Diagnosis not present

## 2024-04-25 DIAGNOSIS — E1169 Type 2 diabetes mellitus with other specified complication: Secondary | ICD-10-CM | POA: Diagnosis not present

## 2024-04-25 DIAGNOSIS — E782 Mixed hyperlipidemia: Secondary | ICD-10-CM | POA: Diagnosis not present

## 2024-04-25 DIAGNOSIS — I10 Essential (primary) hypertension: Secondary | ICD-10-CM | POA: Diagnosis not present

## 2024-04-26 ENCOUNTER — Encounter: Admitting: Physician Assistant

## 2024-04-26 LAB — CBC WITH DIFFERENTIAL/PLATELET
Basophils Absolute: 0 x10E3/uL (ref 0.0–0.2)
Basos: 0 %
EOS (ABSOLUTE): 0.1 x10E3/uL (ref 0.0–0.4)
Eos: 1 %
Hematocrit: 41.2 % (ref 34.0–46.6)
Hemoglobin: 13.6 g/dL (ref 11.1–15.9)
Immature Grans (Abs): 0 x10E3/uL (ref 0.0–0.1)
Immature Granulocytes: 0 %
Lymphocytes Absolute: 2.3 x10E3/uL (ref 0.7–3.1)
Lymphs: 33 %
MCH: 30.5 pg (ref 26.6–33.0)
MCHC: 33 g/dL (ref 31.5–35.7)
MCV: 92 fL (ref 79–97)
Monocytes Absolute: 0.6 x10E3/uL (ref 0.1–0.9)
Monocytes: 8 %
Neutrophils Absolute: 4 x10E3/uL (ref 1.4–7.0)
Neutrophils: 58 %
Platelets: 295 x10E3/uL (ref 150–450)
RBC: 4.46 x10E6/uL (ref 3.77–5.28)
RDW: 13.1 % (ref 11.7–15.4)
WBC: 7 x10E3/uL (ref 3.4–10.8)

## 2024-04-26 LAB — LIPID PANEL
Chol/HDL Ratio: 2.8 ratio (ref 0.0–4.4)
Cholesterol, Total: 146 mg/dL (ref 100–199)
HDL: 53 mg/dL (ref >39)
LDL Chol Calc (NIH): 70 mg/dL (ref 0–99)
Triglycerides: 131 mg/dL (ref 0–149)
VLDL Cholesterol Cal: 23 mg/dL (ref 5–40)

## 2024-04-26 LAB — B12 AND FOLATE PANEL
Folate: 15.3 ng/mL (ref >3.0)
Vitamin B-12: 739 pg/mL (ref 232–1245)

## 2024-04-26 LAB — CMP14+EGFR
ALT: 38 IU/L — ABNORMAL HIGH (ref 0–32)
AST: 29 IU/L (ref 0–40)
Albumin: 4.4 g/dL (ref 3.8–4.9)
Alkaline Phosphatase: 87 IU/L (ref 44–121)
BUN/Creatinine Ratio: 23 (ref 9–23)
BUN: 19 mg/dL (ref 6–24)
Bilirubin Total: 0.4 mg/dL (ref 0.0–1.2)
CO2: 24 mmol/L (ref 20–29)
Calcium: 10.1 mg/dL (ref 8.7–10.2)
Chloride: 98 mmol/L (ref 96–106)
Creatinine, Ser: 0.82 mg/dL (ref 0.57–1.00)
Globulin, Total: 2.6 g/dL (ref 1.5–4.5)
Glucose: 119 mg/dL — ABNORMAL HIGH (ref 70–99)
Potassium: 4.2 mmol/L (ref 3.5–5.2)
Sodium: 138 mmol/L (ref 134–144)
Total Protein: 7 g/dL (ref 6.0–8.5)
eGFR: 83 mL/min/1.73 (ref >59)

## 2024-04-26 LAB — VITAMIN D 25 HYDROXY (VIT D DEFICIENCY, FRACTURES): Vit D, 25-Hydroxy: 42.2 ng/mL (ref 30.0–100.0)

## 2024-04-26 LAB — TSH: TSH: 2.18 u[IU]/mL (ref 0.450–4.500)

## 2024-04-26 NOTE — Progress Notes (Signed)
 Kristen Bright,   B12 and folate look good.  Vitamin D  looks good.  Thyroid  looks good.  LDL, bad cholesterol, looks great.  ALT, one liver enzyme, is a little elevated. Have you been taking more tylenol? We can watch this and recheck in 3 months.

## 2024-04-30 ENCOUNTER — Encounter: Payer: Self-pay | Admitting: Sports Medicine

## 2024-05-15 ENCOUNTER — Other Ambulatory Visit (HOSPITAL_BASED_OUTPATIENT_CLINIC_OR_DEPARTMENT_OTHER): Payer: Self-pay

## 2024-05-15 MED ORDER — TIRZEPATIDE 7.5 MG/0.5ML ~~LOC~~ SOAJ
7.5000 mg | SUBCUTANEOUS | 0 refills | Status: DC
  Filled 2024-05-15: qty 2, 28d supply, fill #0
  Filled 2024-06-07: qty 2, 28d supply, fill #1
  Filled 2024-07-07: qty 2, 28d supply, fill #2

## 2024-05-15 NOTE — Telephone Encounter (Signed)
 Patient requesting rx rf of Mounjaro  with strength increase Last written as Mounjaro  5mg  04/19/2024 Last OV 04/19/2024 Upcoming appt = none Note reads that this was already sent in for the next 2 months but did not see the strength increase in patient chart.

## 2024-06-07 ENCOUNTER — Other Ambulatory Visit (HOSPITAL_BASED_OUTPATIENT_CLINIC_OR_DEPARTMENT_OTHER): Payer: Self-pay

## 2024-06-07 ENCOUNTER — Other Ambulatory Visit: Payer: Self-pay | Admitting: Physician Assistant

## 2024-06-07 ENCOUNTER — Other Ambulatory Visit: Payer: Self-pay

## 2024-06-07 DIAGNOSIS — K219 Gastro-esophageal reflux disease without esophagitis: Secondary | ICD-10-CM

## 2024-06-07 MED ORDER — PANTOPRAZOLE SODIUM 40 MG PO TBEC
40.0000 mg | DELAYED_RELEASE_TABLET | Freq: Every day | ORAL | 3 refills | Status: AC
  Filled 2024-06-07: qty 90, 90d supply, fill #0

## 2024-06-11 ENCOUNTER — Other Ambulatory Visit (HOSPITAL_BASED_OUTPATIENT_CLINIC_OR_DEPARTMENT_OTHER): Payer: Self-pay

## 2024-06-24 ENCOUNTER — Other Ambulatory Visit (HOSPITAL_BASED_OUTPATIENT_CLINIC_OR_DEPARTMENT_OTHER): Payer: Self-pay

## 2024-06-24 MED ORDER — FLUZONE 0.5 ML IM SUSY
0.5000 mL | PREFILLED_SYRINGE | Freq: Once | INTRAMUSCULAR | 0 refills | Status: AC
  Filled 2024-06-24: qty 0.5, 1d supply, fill #0

## 2024-07-02 ENCOUNTER — Other Ambulatory Visit (HOSPITAL_BASED_OUTPATIENT_CLINIC_OR_DEPARTMENT_OTHER): Payer: Self-pay

## 2024-07-02 ENCOUNTER — Ambulatory Visit: Admitting: Physician Assistant

## 2024-07-02 ENCOUNTER — Ambulatory Visit (INDEPENDENT_AMBULATORY_CARE_PROVIDER_SITE_OTHER)

## 2024-07-02 ENCOUNTER — Ambulatory Visit: Payer: Self-pay | Admitting: Physician Assistant

## 2024-07-02 VITALS — BP 116/65 | HR 85 | Ht 60.0 in | Wt 185.0 lb

## 2024-07-02 DIAGNOSIS — R4182 Altered mental status, unspecified: Secondary | ICD-10-CM

## 2024-07-02 DIAGNOSIS — R42 Dizziness and giddiness: Secondary | ICD-10-CM | POA: Diagnosis not present

## 2024-07-02 DIAGNOSIS — R4189 Other symptoms and signs involving cognitive functions and awareness: Secondary | ICD-10-CM | POA: Diagnosis not present

## 2024-07-02 DIAGNOSIS — F439 Reaction to severe stress, unspecified: Secondary | ICD-10-CM | POA: Diagnosis not present

## 2024-07-02 DIAGNOSIS — F411 Generalized anxiety disorder: Secondary | ICD-10-CM | POA: Diagnosis not present

## 2024-07-02 MED ORDER — BUSPIRONE HCL 7.5 MG PO TABS
7.5000 mg | ORAL_TABLET | Freq: Two times a day (BID) | ORAL | 0 refills | Status: DC
  Filled 2024-07-02 (×2): qty 60, 30d supply, fill #0

## 2024-07-02 NOTE — Progress Notes (Signed)
 GREAT news. No abnormality found!

## 2024-07-02 NOTE — Patient Instructions (Signed)
 Add buspar twice a day.

## 2024-07-02 NOTE — Progress Notes (Signed)
 Acute Office Visit  Subjective:     Patient ID: Kristen Bright, female    DOB: 17-Dec-1965, 58 y.o.   MRN: [id number]  Chief Complaint  Patient presents with   Dizziness    HPI .SABRADiscussed the use of AI scribe software for clinical note transcription with the patient, who gave verbal consent to proceed.  History of Present Illness Kristen Bright is a 58 year old female who presents with episodes of dizziness and brain fog following a laughing incident.  Neurological symptoms - Episodes of dizziness and sensation of brain fog since Sunday after laughing very hard and experiencing transient blackout - Mild disorientation and irritability since the incident - Able to complete basic tasks but feels overwhelmed by larger tasks, resulting in taking a day off work, which is unusual for her - No numbness, tingling, strength changes, or speech changes - No vision changes, hearing loss, or tinnitus - No sensation of the room spinning or feeling like she is going to pass out, except for the initial incident - Mild headache resolved with sleep medication  Sleep disturbance - Difficulty sleeping, possibly related to caffeine intake - Good night's sleep last night with nine hours on CPAP - Persistent symptoms of brain fog and irritability despite improved sleep  Psychological symptoms - Anxiety and stress attributed to husband's health issues and family situation - Irritability and feeling overwhelmed by larger tasks  Weight management and appetite - Weight gain - Mounjaro  medication perceived as effective - Increased food intake at times due to stress  Musculoskeletal symptoms - No neck pain, but neck stiffness present for months  Respiratory symptoms - No breathing difficulties, coughing, or fever - Occasional shortness of breath with walking  Genitourinary symptoms - No urinary symptoms    ROS See HPI.     Objective:    BP 116/65   Pulse 85   Ht 5' (1.524 m)   Wt  185 lb (83.9 kg)   SpO2 99%   BMI 36.13 kg/m  BP Readings from Last 3 Encounters:  07/02/24 116/65  04/19/24 100/60  01/17/24 118/60   Wt Readings from Last 3 Encounters:  07/02/24 185 lb (83.9 kg)  04/19/24 185 lb 9.6 oz (84.2 kg)  01/17/24 184 lb (83.5 kg)      Physical Exam Constitutional:      Appearance: Normal appearance.  HENT:     Head: Normocephalic.     Right Ear: Tympanic membrane, ear canal and external ear normal. There is no impacted cerumen.     Left Ear: Tympanic membrane, ear canal and external ear normal. There is no impacted cerumen.     Nose: Nose normal.     Mouth/Throat:     Mouth: Mucous membranes are moist.  Eyes:     General:        Right eye: No discharge.        Left eye: No discharge.     Extraocular Movements: Extraocular movements intact.     Conjunctiva/sclera: Conjunctivae normal.     Pupils: Pupils are equal, round, and reactive to light.  Neck:     Vascular: No carotid bruit.  Cardiovascular:     Rate and Rhythm: Normal rate and regular rhythm.  Pulmonary:     Effort: Pulmonary effort is normal.     Breath sounds: Normal breath sounds.  Musculoskeletal:     Cervical back: No tenderness.     Right lower leg: No edema.     Left lower  leg: No edema.  Lymphadenopathy:     Cervical: No cervical adenopathy.  Neurological:     General: No focal deficit present.     Mental Status: She is alert and oriented to person, place, and time.     Cranial Nerves: No cranial nerve deficit.     Sensory: No sensory deficit.     Motor: No weakness.     Coordination: Coordination normal.     Gait: Gait normal.     Deep Tendon Reflexes: Reflexes normal.     Comments: Dix hallpike negative.   Psychiatric:        Mood and Affect: Mood normal.     Comments: Tearful.          Assessment & Plan:  SABRASABRALechelle was seen today for dizziness.  Diagnoses and all orders for this visit:  Brain fog -     CT HEAD WO CONTRAST ( ); Future  Dizziness -      CT HEAD WO CONTRAST ( ); Future  Altered mental status, unspecified altered mental status type -     CT HEAD WO CONTRAST ( ); Future  GAD (generalized anxiety disorder) -     busPIRone (BUSPAR) 7.5 MG tablet; Take 1 tablet (7.5 mg total) by mouth 2 (two) times daily.  Stress -     busPIRone (BUSPAR) 7.5 MG tablet; Take 1 tablet (7.5 mg total) by mouth 2 (two) times daily.   Assessment & Plan Altered mental status (brain fog, disorientation) Brain fog and disorientation post-laughing episode with brief blackout. No neurological deficits. Differential includes TIA or stress-induced symptoms. - Order CT of the head to rule out mass or active clot. - Orthostatic Vitals normal - NO symptoms of BPPV and Dix- Hallpike normal.   Anxiety disorder Increased anxiety due to life stressors. Symptoms include irritability and feeling overwhelmed. No acute anxiety medication used. Buspar considered for management. - Prescribe Buspar for anxiety management. - Continue Effexor   Obesity/T2DM Mounjaro  effective, but stress-related eating may contribute to weight gain.  Goals of Care Stress from husband's health issues and family responsibilities affects her ability to enjoy personal milestones, contributing to anxiety and altered mental status.    Return in about 2 weeks (around 07/16/2024).  Shanikka Wonders, PA-C

## 2024-07-03 ENCOUNTER — Other Ambulatory Visit: Payer: Self-pay

## 2024-07-03 ENCOUNTER — Encounter: Payer: Self-pay | Admitting: Physician Assistant

## 2024-07-08 ENCOUNTER — Encounter: Payer: Self-pay | Admitting: Physician Assistant

## 2024-07-08 ENCOUNTER — Other Ambulatory Visit (HOSPITAL_BASED_OUTPATIENT_CLINIC_OR_DEPARTMENT_OTHER): Payer: Self-pay

## 2024-07-09 ENCOUNTER — Other Ambulatory Visit (HOSPITAL_BASED_OUTPATIENT_CLINIC_OR_DEPARTMENT_OTHER): Payer: Self-pay

## 2024-07-12 ENCOUNTER — Encounter: Payer: Self-pay | Admitting: *Deleted

## 2024-07-12 DIAGNOSIS — Z006 Encounter for examination for normal comparison and control in clinical research program: Secondary | ICD-10-CM

## 2024-07-12 NOTE — Research (Signed)
 Spoke with Kristen Bright about pre-event research study. She states ok to send her more information. Emailed consents for her to read over. Encouraged her to email or call with any question.

## 2024-08-15 ENCOUNTER — Other Ambulatory Visit: Payer: Self-pay | Admitting: Physician Assistant

## 2024-08-15 DIAGNOSIS — H524 Presbyopia: Secondary | ICD-10-CM | POA: Diagnosis not present

## 2024-08-16 ENCOUNTER — Encounter (HOSPITAL_BASED_OUTPATIENT_CLINIC_OR_DEPARTMENT_OTHER): Payer: Self-pay

## 2024-08-16 ENCOUNTER — Other Ambulatory Visit (HOSPITAL_BASED_OUTPATIENT_CLINIC_OR_DEPARTMENT_OTHER): Payer: Self-pay

## 2024-08-16 ENCOUNTER — Encounter: Payer: Self-pay | Admitting: Physician Assistant

## 2024-08-16 LAB — OPHTHALMOLOGY REPORT-SCANNED

## 2024-08-16 MED ORDER — TIRZEPATIDE 7.5 MG/0.5ML ~~LOC~~ SOAJ
7.5000 mg | SUBCUTANEOUS | 0 refills | Status: DC
  Filled 2024-08-16: qty 2, 28d supply, fill #0

## 2024-08-16 NOTE — Telephone Encounter (Signed)
 Please see note from patient . States that the Mounjaro  7.5mg  was denied by prescriber.  Last written 05/15/2024 Last OV 07/02/2024 Note reads : Obesity/T2DM Mounjaro  effective, but stress-related eating may contribute to weight gain.Return in about 2 weeks (around 07/16/2024).  Patient has now been scheduled for 09/03/2024 for follow up can the mounjaro  be resent until patient can be seen ?  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

## 2024-08-30 ENCOUNTER — Other Ambulatory Visit: Payer: Self-pay | Admitting: Physician Assistant

## 2024-08-30 DIAGNOSIS — F411 Generalized anxiety disorder: Secondary | ICD-10-CM

## 2024-08-30 DIAGNOSIS — F439 Reaction to severe stress, unspecified: Secondary | ICD-10-CM

## 2024-09-02 ENCOUNTER — Other Ambulatory Visit: Payer: Self-pay | Admitting: Physician Assistant

## 2024-09-02 ENCOUNTER — Other Ambulatory Visit (HOSPITAL_BASED_OUTPATIENT_CLINIC_OR_DEPARTMENT_OTHER): Payer: Self-pay

## 2024-09-02 DIAGNOSIS — Z1231 Encounter for screening mammogram for malignant neoplasm of breast: Secondary | ICD-10-CM

## 2024-09-02 MED ORDER — BUSPIRONE HCL 7.5 MG PO TABS
7.5000 mg | ORAL_TABLET | Freq: Two times a day (BID) | ORAL | 0 refills | Status: AC
  Filled 2024-09-02: qty 60, 30d supply, fill #0

## 2024-09-04 ENCOUNTER — Ambulatory Visit: Admitting: Physician Assistant

## 2024-09-05 NOTE — Progress Notes (Signed)
" ° °  Subjective:    Patient ID: Kristen Bright, female    DOB: 10-30-1965, 59 y.o.   MRN: [id number]  HPI  Patient is here for the 1st dose of the shringrix vaccine.  Review of Systems     Objective:   Physical Exam        Assessment & Plan:   Injection given in LD. Tolerated well. No redness or swelling noted at the injection site. Patient advised to RTC within 2-6 months for 2nd dose. (Between 11/04/24-03/06/25) "

## 2024-09-06 ENCOUNTER — Ambulatory Visit (INDEPENDENT_AMBULATORY_CARE_PROVIDER_SITE_OTHER)

## 2024-09-06 VITALS — Temp 98.7°F

## 2024-09-06 DIAGNOSIS — Z23 Encounter for immunization: Secondary | ICD-10-CM | POA: Diagnosis not present

## 2024-09-13 ENCOUNTER — Ambulatory Visit: Admitting: Physician Assistant

## 2024-09-17 ENCOUNTER — Ambulatory Visit: Admitting: Physician Assistant

## 2024-09-17 ENCOUNTER — Other Ambulatory Visit (HOSPITAL_BASED_OUTPATIENT_CLINIC_OR_DEPARTMENT_OTHER): Payer: Self-pay

## 2024-09-17 VITALS — BP 126/66 | HR 87 | Ht 60.0 in | Wt 185.0 lb

## 2024-09-17 DIAGNOSIS — F411 Generalized anxiety disorder: Secondary | ICD-10-CM

## 2024-09-17 DIAGNOSIS — Z6836 Body mass index (BMI) 36.0-36.9, adult: Secondary | ICD-10-CM

## 2024-09-17 DIAGNOSIS — E785 Hyperlipidemia, unspecified: Secondary | ICD-10-CM

## 2024-09-17 DIAGNOSIS — E66812 Obesity, class 2: Secondary | ICD-10-CM | POA: Diagnosis not present

## 2024-09-17 DIAGNOSIS — E1169 Type 2 diabetes mellitus with other specified complication: Secondary | ICD-10-CM

## 2024-09-17 DIAGNOSIS — I10 Essential (primary) hypertension: Secondary | ICD-10-CM

## 2024-09-17 DIAGNOSIS — R4189 Other symptoms and signs involving cognitive functions and awareness: Secondary | ICD-10-CM | POA: Diagnosis not present

## 2024-09-17 LAB — POCT GLYCOSYLATED HEMOGLOBIN (HGB A1C): Hemoglobin A1C: 6.9 % — AB (ref 4.0–5.6)

## 2024-09-17 MED ORDER — TIRZEPATIDE 10 MG/0.5ML ~~LOC~~ SOAJ
10.0000 mg | SUBCUTANEOUS | 0 refills | Status: AC
  Filled 2024-09-17 (×3): qty 2, 28d supply, fill #0

## 2024-09-17 MED ORDER — OLMESARTAN MEDOXOMIL-HCTZ 40-25 MG PO TABS
1.0000 | ORAL_TABLET | Freq: Every day | ORAL | 3 refills | Status: AC
  Filled 2024-09-17 – 2024-09-18 (×3): qty 90, 90d supply, fill #0

## 2024-09-17 MED ORDER — VENLAFAXINE HCL ER 150 MG PO CP24
150.0000 mg | ORAL_CAPSULE | Freq: Every day | ORAL | 1 refills | Status: AC
  Filled 2024-09-17: qty 90, 90d supply, fill #0

## 2024-09-17 MED ORDER — DAPAGLIFLOZIN PROPANEDIOL 10 MG PO TABS
10.0000 mg | ORAL_TABLET | Freq: Every day | ORAL | 1 refills | Status: AC
  Filled 2024-09-17 – 2024-09-18 (×3): qty 90, 90d supply, fill #0

## 2024-09-17 NOTE — Progress Notes (Unsigned)
 "  Established Patient Office Visit  Subjective   Patient ID: Kristen Bright, female    DOB: 12-05-1965  Age: 59 y.o. MRN: [id number]  Chief Complaint  Patient presents with   Medical Management of Chronic Issues    HPI .Discussed the use of AI scribe software for clinical note transcription with the patient, who gave verbal consent to proceed.  History of Present Illness Kristen Bright is a 59 year old female with type 2 diabetes and anxiety who presents for follow-up of her blood sugar control and anxiety management.  Glycemic control - Type 2 diabetes managed with Mounjaro  7.5 mg and Riomet . - A1c currently 6.7%. - A1c previously stable at 6-6.5% while on Ozempic ; increased after switching to Mounjaro . - Occasionally delays taking Farxiga .  Anxiety and neurocognitive symptoms - Anxiety managed with Buspar  as needed and Effexor  at night. - Buspar  reduces 'food noise' and anxiety-related symptoms. - Forgets to take Effexor  at night at times, resulting in increased anxiety. - Episodes of brain fog and dizziness, particularly during stress. - Habit of pulling skin when stressed, sometimes causing bleeding.  Hypertension and cardiovascular risk management - History of elevated blood pressure. - Takes antihypertensive, cholesterol medication, and aspirin regularly. - Occasionally delays taking Effexor  and Farxiga .  Respiratory symptoms and physical activity - Occasional shortness of breath when carrying heavy items or walking uphill. - No shortness of breath during routine activities such as walking to her office. - Physical activity is limited, but she tries to stay active at work by lifting weights and moving around.  Immunization reactions and considerations - Past adverse reaction to shingles vaccine with prolonged arm pain. - Considering pneumonia vaccine and TDAP booster due to upcoming family events.    ROS See HPI.    Objective:     BP 126/66   Pulse 87   Ht  5' (1.524 m)   Wt 185 lb (83.9 kg)   SpO2 99%   BMI 36.13 kg/m  BP Readings from Last 3 Encounters:  09/17/24 126/66  07/02/24 116/65  04/19/24 100/60   Wt Readings from Last 3 Encounters:  09/17/24 185 lb (83.9 kg)  07/02/24 185 lb (83.9 kg)  04/19/24 185 lb 9.6 oz (84.2 kg)      Physical Exam   Results for orders placed or performed in visit on 09/17/24  POCT HgB A1C  Result Value Ref Range   Hemoglobin A1C 6.9 (A) 4.0 - 5.6 %   HbA1c POC (<> result, manual entry)     HbA1c, POC (prediabetic range)     HbA1c, POC (controlled diabetic range)        The 10-year ASCVD risk score (Arnett DK, et al., 2019) is: 4.7%    Assessment & Plan:  .Sedona was seen today for medical management of chronic issues.  Diagnoses and all orders for this visit:  Hyperlipidemia associated with type 2 diabetes mellitus (HCC) -     POCT HgB A1C -     tirzepatide  (MOUNJARO ) 10 MG/0.5ML Pen; Inject 10 mg into the skin once a week. -     dapagliflozin  propanediol (FARXIGA ) 10 MG TABS tablet; Take 1 tablet (10 mg total) by mouth daily.  Brain fog  GAD (generalized anxiety disorder) -     venlafaxine  XR (EFFEXOR  XR) 150 MG 24 hr capsule; Take 1 capsule (150 mg total) by mouth daily with breakfast.  Essential hypertension, benign -     olmesartan -hydrochlorothiazide  (BENICAR  HCT) 40-25 MG tablet; Take 1 tablet by  mouth daily.   Assessment & Plan Type 2 diabetes mellitus with other specified complication A1c increased to 6.7 after switching to Mounjaro  7.5 mg, indicating insufficient control. - Increased Mounjaro  dose to 10 mg once weekly. - Recheck A1c in 3 months.  Generalized anxiety disorder Anxiety symptoms include brain fog and dizziness, worsened by non-compliance with Effexor . - Encouraged compliance with Effexor .  General health maintenance Due for pneumonia vaccination. Discussed vaccination importance, especially with upcoming births of grandchildren. - Administer  pneumonia vaccine on Friday. - Consider TDAP vaccine due to upcoming births of grandchildren.     Kristen Fackrell, PA-C  "

## 2024-09-18 ENCOUNTER — Other Ambulatory Visit: Payer: Self-pay

## 2024-09-18 ENCOUNTER — Encounter: Payer: Self-pay | Admitting: Physician Assistant

## 2024-09-18 ENCOUNTER — Other Ambulatory Visit (HOSPITAL_BASED_OUTPATIENT_CLINIC_OR_DEPARTMENT_OTHER): Payer: Self-pay

## 2024-09-18 ENCOUNTER — Other Ambulatory Visit (HOSPITAL_COMMUNITY): Payer: Self-pay

## 2024-09-20 ENCOUNTER — Ambulatory Visit

## 2024-09-25 ENCOUNTER — Ambulatory Visit: Admitting: Dermatology

## 2024-09-26 ENCOUNTER — Other Ambulatory Visit: Payer: Self-pay

## 2024-09-27 ENCOUNTER — Ambulatory Visit

## 2024-09-27 DIAGNOSIS — Z1231 Encounter for screening mammogram for malignant neoplasm of breast: Secondary | ICD-10-CM

## 2024-10-02 ENCOUNTER — Ambulatory Visit: Payer: Self-pay | Admitting: Physician Assistant

## 2024-10-02 NOTE — Progress Notes (Signed)
Normal Mammogram- Follow up in 1 year.

## 2024-10-03 ENCOUNTER — Other Ambulatory Visit (HOSPITAL_COMMUNITY): Payer: Self-pay

## 2024-10-23 ENCOUNTER — Ambulatory Visit: Admitting: Dermatology

## 2025-01-03 ENCOUNTER — Ambulatory Visit
